# Patient Record
Sex: Male | Born: 1955 | Race: White | Hispanic: No | Marital: Married | Smoking: Never smoker
Health system: Southern US, Community
[De-identification: ages and names within clinical notes are randomized; demographics above are authoritative.]

## PROBLEM LIST (undated history)

## (undated) DIAGNOSIS — R040 Epistaxis: Secondary | ICD-10-CM

## (undated) DIAGNOSIS — Z8711 Personal history of peptic ulcer disease: Secondary | ICD-10-CM

## (undated) DIAGNOSIS — R51 Headache: Secondary | ICD-10-CM

## (undated) DIAGNOSIS — I951 Orthostatic hypotension: Secondary | ICD-10-CM

## (undated) DIAGNOSIS — G629 Polyneuropathy, unspecified: Secondary | ICD-10-CM

## (undated) DIAGNOSIS — G319 Degenerative disease of nervous system, unspecified: Secondary | ICD-10-CM

## (undated) DIAGNOSIS — C801 Malignant (primary) neoplasm, unspecified: Secondary | ICD-10-CM

## (undated) DIAGNOSIS — G238 Other specified degenerative diseases of basal ganglia: Secondary | ICD-10-CM

## (undated) DIAGNOSIS — I429 Cardiomyopathy, unspecified: Secondary | ICD-10-CM

## (undated) DIAGNOSIS — D899 Disorder involving the immune mechanism, unspecified: Secondary | ICD-10-CM

## (undated) DIAGNOSIS — G20 Parkinson's disease: Secondary | ICD-10-CM

## (undated) DIAGNOSIS — Z5189 Encounter for other specified aftercare: Secondary | ICD-10-CM

## (undated) DIAGNOSIS — Z8719 Personal history of other diseases of the digestive system: Secondary | ICD-10-CM

## (undated) DIAGNOSIS — G43909 Migraine, unspecified, not intractable, without status migrainosus: Secondary | ICD-10-CM

## (undated) DIAGNOSIS — J329 Chronic sinusitis, unspecified: Secondary | ICD-10-CM

## (undated) DIAGNOSIS — Z941 Heart transplant status: Secondary | ICD-10-CM

## (undated) DIAGNOSIS — K219 Gastro-esophageal reflux disease without esophagitis: Secondary | ICD-10-CM

## (undated) DIAGNOSIS — K635 Polyp of colon: Secondary | ICD-10-CM

## (undated) DIAGNOSIS — Z9109 Other allergy status, other than to drugs and biological substances: Secondary | ICD-10-CM

## (undated) DIAGNOSIS — N289 Disorder of kidney and ureter, unspecified: Secondary | ICD-10-CM

## (undated) DIAGNOSIS — I509 Heart failure, unspecified: Secondary | ICD-10-CM

## (undated) DIAGNOSIS — N179 Acute kidney failure, unspecified: Secondary | ICD-10-CM

## (undated) DIAGNOSIS — R251 Tremor, unspecified: Secondary | ICD-10-CM

## (undated) DIAGNOSIS — I10 Essential (primary) hypertension: Secondary | ICD-10-CM

## (undated) DIAGNOSIS — C4491 Basal cell carcinoma of skin, unspecified: Secondary | ICD-10-CM

## (undated) DIAGNOSIS — Q999 Chromosomal abnormality, unspecified: Secondary | ICD-10-CM

## (undated) DIAGNOSIS — G35 Multiple sclerosis: Secondary | ICD-10-CM

## (undated) DIAGNOSIS — R55 Syncope and collapse: Secondary | ICD-10-CM

## (undated) DIAGNOSIS — T7840XA Allergy, unspecified, initial encounter: Secondary | ICD-10-CM

## (undated) DIAGNOSIS — I519 Heart disease, unspecified: Secondary | ICD-10-CM

## (undated) DIAGNOSIS — N189 Chronic kidney disease, unspecified: Secondary | ICD-10-CM

## (undated) DIAGNOSIS — Z959 Presence of cardiac and vascular implant and graft, unspecified: Secondary | ICD-10-CM

## (undated) DIAGNOSIS — Z87442 Personal history of urinary calculi: Secondary | ICD-10-CM

## (undated) DIAGNOSIS — Z79899 Other long term (current) drug therapy: Secondary | ICD-10-CM

## (undated) DIAGNOSIS — B078 Other viral warts: Secondary | ICD-10-CM

## (undated) HISTORY — DX: Degenerative disease of nervous system, unspecified: G31.9

## (undated) HISTORY — DX: Chromosomal abnormality, unspecified: Q99.9

## (undated) HISTORY — DX: Heart transplant status: Z94.1

## (undated) HISTORY — DX: Other long term (current) drug therapy: Z79.899

## (undated) HISTORY — DX: Tremor, unspecified: R25.1

## (undated) HISTORY — DX: Migraine, unspecified, not intractable, without status migrainosus: G43.909

## (undated) HISTORY — PX: CARDIAC DEFIBRILLATOR PLACEMENT: SHX171

## (undated) HISTORY — DX: Polyp of colon: K63.5

## (undated) HISTORY — DX: Malignant (primary) neoplasm, unspecified: C80.1

## (undated) HISTORY — DX: Heart disease, unspecified: I51.9

## (undated) HISTORY — DX: Basal cell carcinoma of skin, unspecified: C44.91

## (undated) HISTORY — DX: Cardiomyopathy, unspecified: I42.9

## (undated) HISTORY — DX: Other viral warts: B07.8

## (undated) HISTORY — DX: Other allergy status, other than to drugs and biological substances: Z91.09

## (undated) HISTORY — DX: Acute kidney failure, unspecified: N17.9

## (undated) HISTORY — DX: Chronic kidney disease, unspecified: N18.9

## (undated) HISTORY — DX: Personal history of peptic ulcer disease: Z87.11

## (undated) HISTORY — DX: Encounter for other specified aftercare: Z51.89

## (undated) HISTORY — DX: Chronic sinusitis, unspecified: J32.9

## (undated) HISTORY — DX: Presence of cardiac and vascular implant and graft, unspecified: Z95.9

## (undated) HISTORY — DX: Epistaxis: R04.0

## (undated) HISTORY — DX: Polyneuropathy, unspecified: G62.9

## (undated) HISTORY — DX: Orthostatic hypotension: I95.1

## (undated) HISTORY — DX: Allergy, unspecified, initial encounter: T78.40XA

## (undated) HISTORY — DX: Gastro-esophageal reflux disease without esophagitis: K21.9

## (undated) HISTORY — PX: CATARACT EXTRACTION W/ INTRAOCULAR LENS  IMPLANT, BILATERAL: SHX1307

## (undated) HISTORY — PX: CARDIAC SURGERY: SHX584

## (undated) HISTORY — DX: Personal history of other diseases of the digestive system: Z87.19

## (undated) HISTORY — DX: Disorder involving the immune mechanism, unspecified: D89.9

## (undated) HISTORY — DX: Parkinson's disease: G20

---

## 1996-09-20 DIAGNOSIS — G43909 Migraine, unspecified, not intractable, without status migrainosus: Secondary | ICD-10-CM

## 1996-09-20 HISTORY — DX: Migraine, unspecified, not intractable, without status migrainosus: G43.909

## 1997-09-20 DIAGNOSIS — I429 Cardiomyopathy, unspecified: Secondary | ICD-10-CM

## 1997-09-20 HISTORY — DX: Cardiomyopathy, unspecified: I42.9

## 1998-09-04 DIAGNOSIS — I509 Heart failure, unspecified: Secondary | ICD-10-CM

## 1998-09-04 HISTORY — DX: Heart failure, unspecified: I50.9

## 1998-09-09 ENCOUNTER — Observation Stay (HOSPITAL_COMMUNITY): Admission: AD | Admit: 1998-09-09 | Discharge: 1998-09-10 | Payer: Self-pay | Admitting: Cardiology

## 2000-12-27 ENCOUNTER — Ambulatory Visit (HOSPITAL_COMMUNITY): Admission: RE | Admit: 2000-12-27 | Discharge: 2000-12-27 | Payer: Self-pay | Admitting: *Deleted

## 2001-10-28 ENCOUNTER — Inpatient Hospital Stay (HOSPITAL_COMMUNITY): Admission: EM | Admit: 2001-10-28 | Discharge: 2001-11-03 | Payer: Self-pay

## 2001-11-03 ENCOUNTER — Encounter: Payer: Self-pay | Admitting: Cardiology

## 2003-07-11 ENCOUNTER — Encounter: Payer: Self-pay | Admitting: Emergency Medicine

## 2003-07-11 ENCOUNTER — Emergency Department (HOSPITAL_COMMUNITY): Admission: EM | Admit: 2003-07-11 | Discharge: 2003-07-11 | Payer: Self-pay | Admitting: Emergency Medicine

## 2003-07-12 ENCOUNTER — Inpatient Hospital Stay (HOSPITAL_COMMUNITY): Admission: AD | Admit: 2003-07-12 | Discharge: 2003-07-27 | Payer: Self-pay | Admitting: Internal Medicine

## 2003-07-16 ENCOUNTER — Encounter: Payer: Self-pay | Admitting: Cardiology

## 2003-07-17 ENCOUNTER — Encounter: Payer: Self-pay | Admitting: Internal Medicine

## 2003-09-21 DIAGNOSIS — Z941 Heart transplant status: Secondary | ICD-10-CM

## 2003-09-21 HISTORY — DX: Heart transplant status: Z94.1

## 2003-09-21 HISTORY — PX: CARDIAC CATHETERIZATION: SHX172

## 2004-03-13 ENCOUNTER — Inpatient Hospital Stay (HOSPITAL_COMMUNITY): Admission: EM | Admit: 2004-03-13 | Discharge: 2004-03-25 | Payer: Self-pay | Admitting: Emergency Medicine

## 2004-03-13 ENCOUNTER — Encounter: Payer: Self-pay | Admitting: Cardiology

## 2004-04-08 ENCOUNTER — Emergency Department (HOSPITAL_COMMUNITY): Admission: EM | Admit: 2004-04-08 | Discharge: 2004-04-09 | Payer: Self-pay | Admitting: Emergency Medicine

## 2004-04-08 ENCOUNTER — Ambulatory Visit (HOSPITAL_COMMUNITY): Admission: RE | Admit: 2004-04-08 | Discharge: 2004-04-08 | Payer: Self-pay | Admitting: Cardiology

## 2004-04-13 ENCOUNTER — Inpatient Hospital Stay (HOSPITAL_COMMUNITY): Admission: EM | Admit: 2004-04-13 | Discharge: 2004-04-20 | Payer: Self-pay | Admitting: Internal Medicine

## 2004-04-20 HISTORY — PX: HEART TRANSPLANT: SHX268

## 2004-05-10 DIAGNOSIS — Z941 Heart transplant status: Secondary | ICD-10-CM

## 2004-05-10 HISTORY — DX: Heart transplant status: Z94.1

## 2004-05-13 DIAGNOSIS — R251 Tremor, unspecified: Secondary | ICD-10-CM

## 2004-05-13 HISTORY — DX: Tremor, unspecified: R25.1

## 2005-06-09 ENCOUNTER — Emergency Department (HOSPITAL_COMMUNITY): Admission: EM | Admit: 2005-06-09 | Discharge: 2005-06-09 | Payer: Self-pay | Admitting: Emergency Medicine

## 2005-09-20 DIAGNOSIS — G629 Polyneuropathy, unspecified: Secondary | ICD-10-CM

## 2005-09-20 HISTORY — DX: Polyneuropathy, unspecified: G62.9

## 2005-11-21 IMAGING — CR DG CHEST 1V PORT
1 series · 1 of 1 positions shown · non-contrast
Comparison: none

CLINICAL DATA: Fever.  CHF.
 PORTABLE CHEST
 Comparison to 03/14/04. 
 Enlargement of the cardiopericardial silhouette with bibasilar atelectasis, pulmonary vascular congestion noted.  Slight improved aeration of the lung bases noted.  Pacer/AICD again noted. 
 IMPRESSION
 Slight improved aeration lung bases.  Otherwise unchanged chest.

[view not recorded]
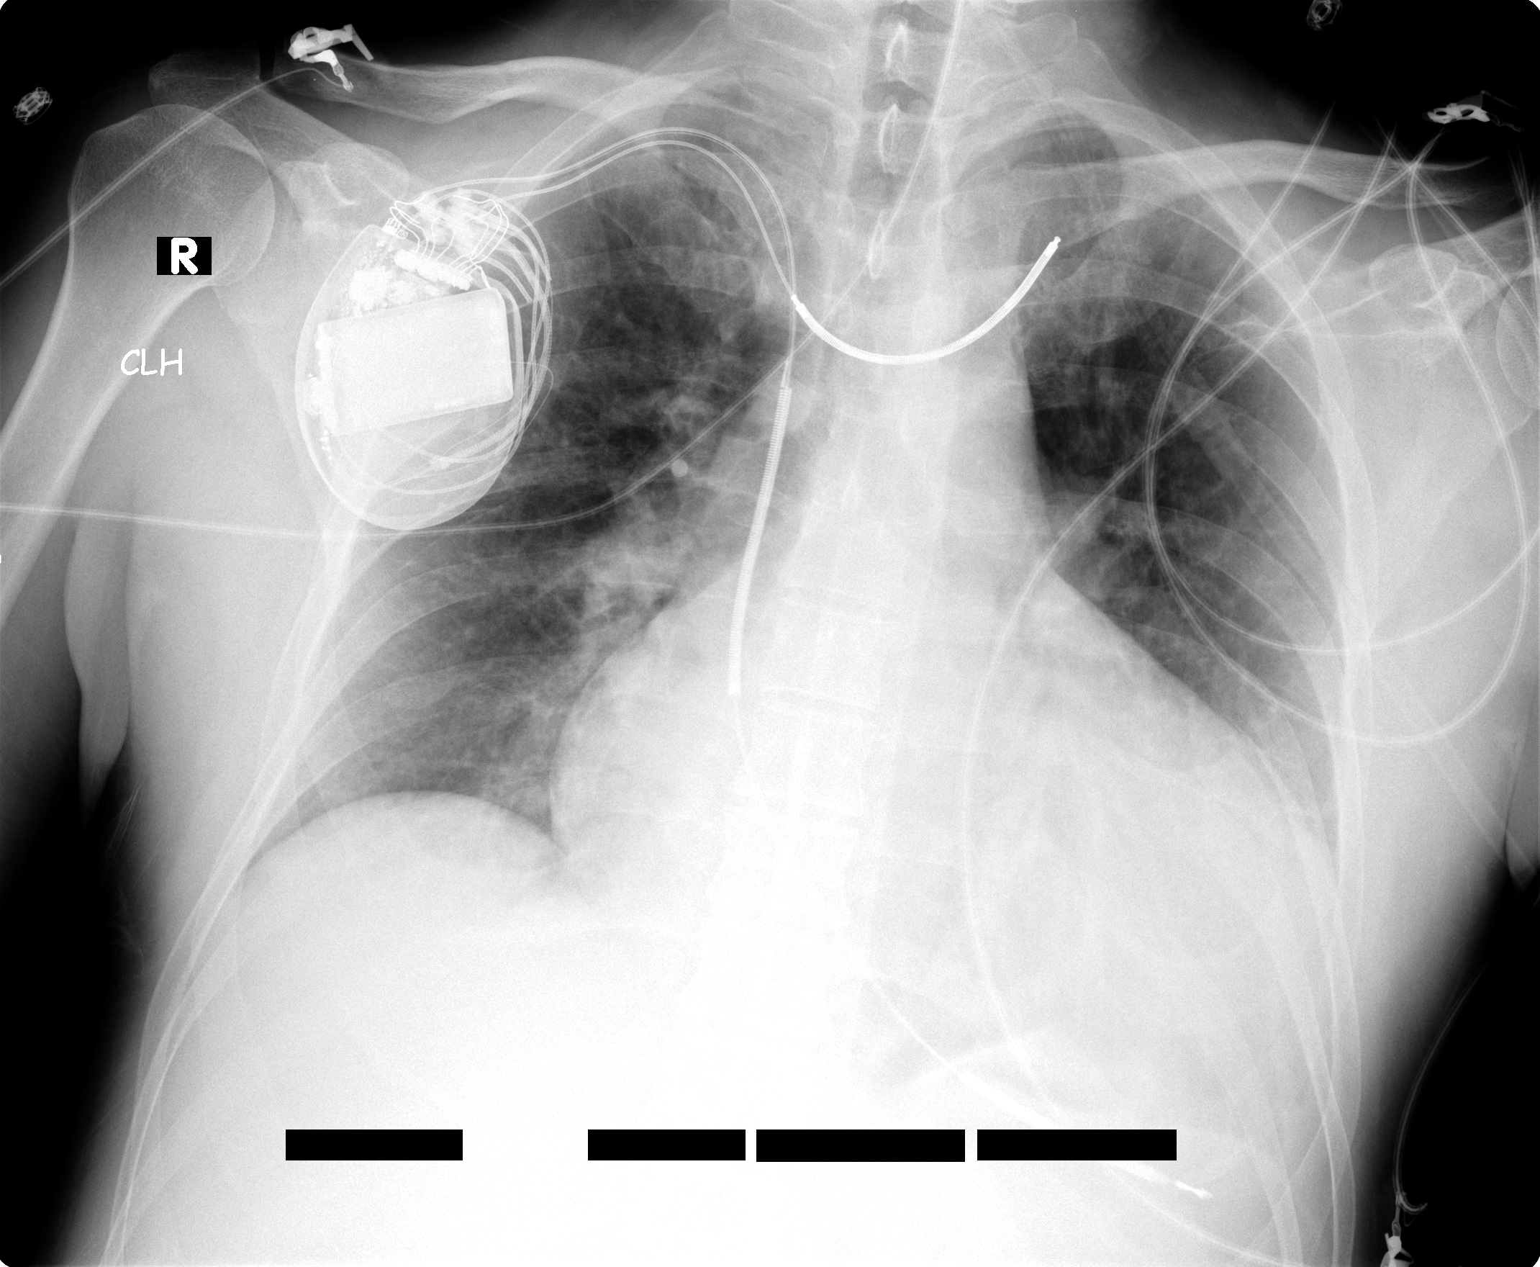

[1 of 1 positions shown; findings below may reference images not displayed]

## 2005-11-23 IMAGING — CR DG CHEST 1V PORT
1 series · 1 of 1 positions shown · non-contrast
Comparison: 03/15/04.

CLINICAL DATA: CHF and fever.  
 PORTABLE SINGLE VIEW CHEST - 03/17/04 - [DATE] HOURS

[view not recorded]
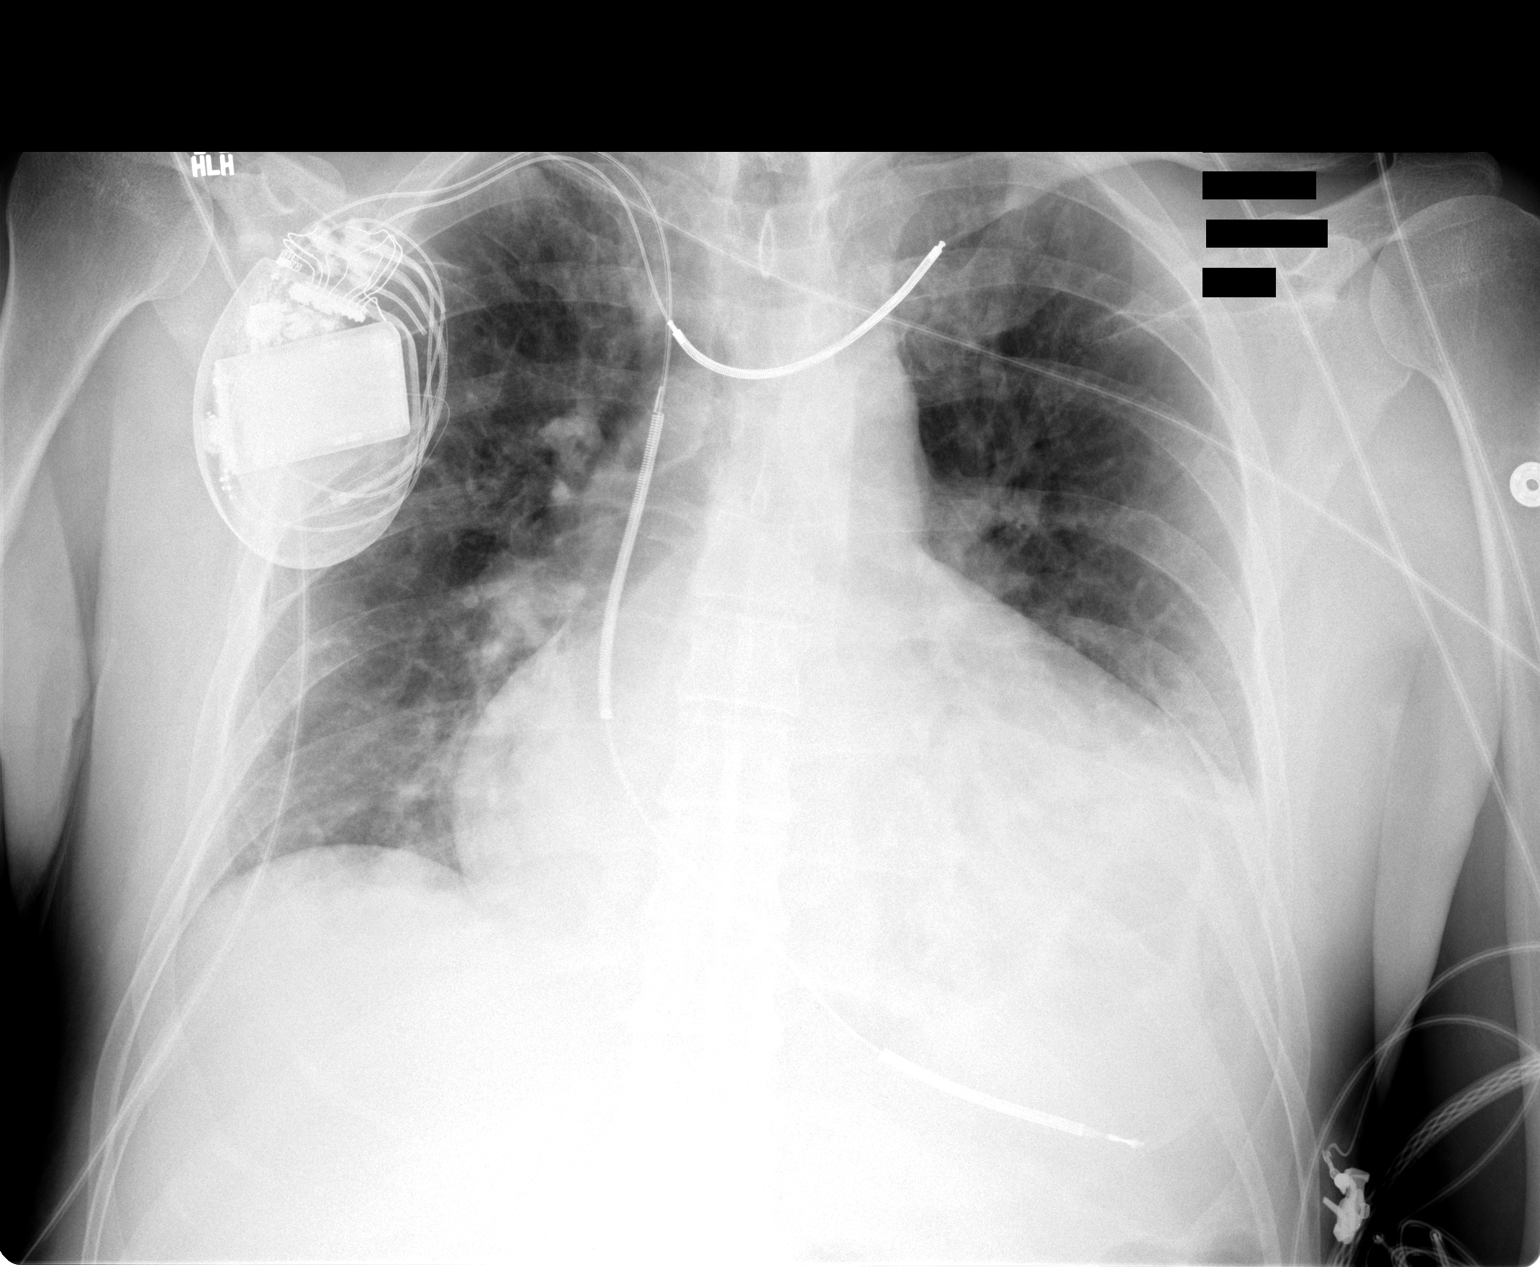

[1 of 1 positions shown; findings below may reference images not displayed]

Stable pulmonary venous hypertension and cardiomegaly.  
 IMPRESSION
 Stable appearance of chest.

## 2005-11-25 IMAGING — CR DG CHEST 2V
2 series · 2 of 2 positions shown · non-contrast
Comparison: 03/17/04.

CLINICAL DATA: Status-post removal of cardiac defibrillator. 
 TWO VIEW CHEST

[view not recorded (1 of 2)]
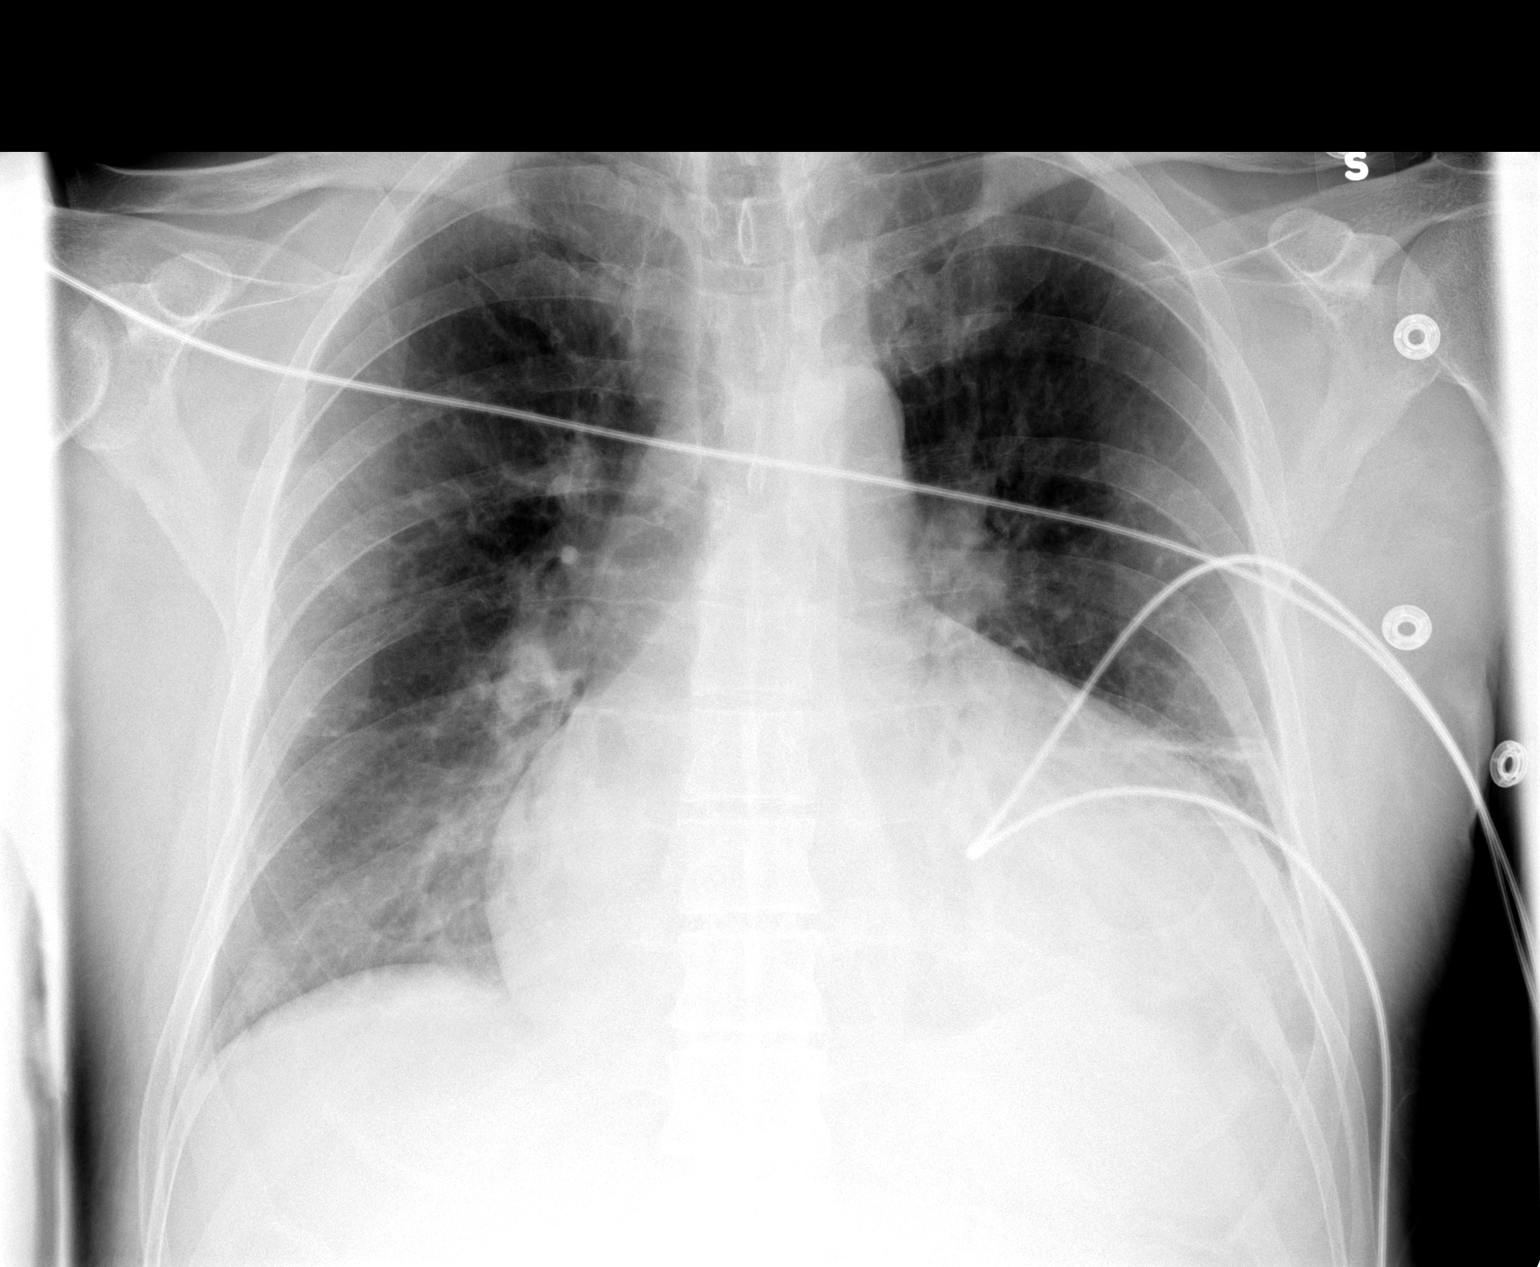

[view not recorded (2 of 2)]
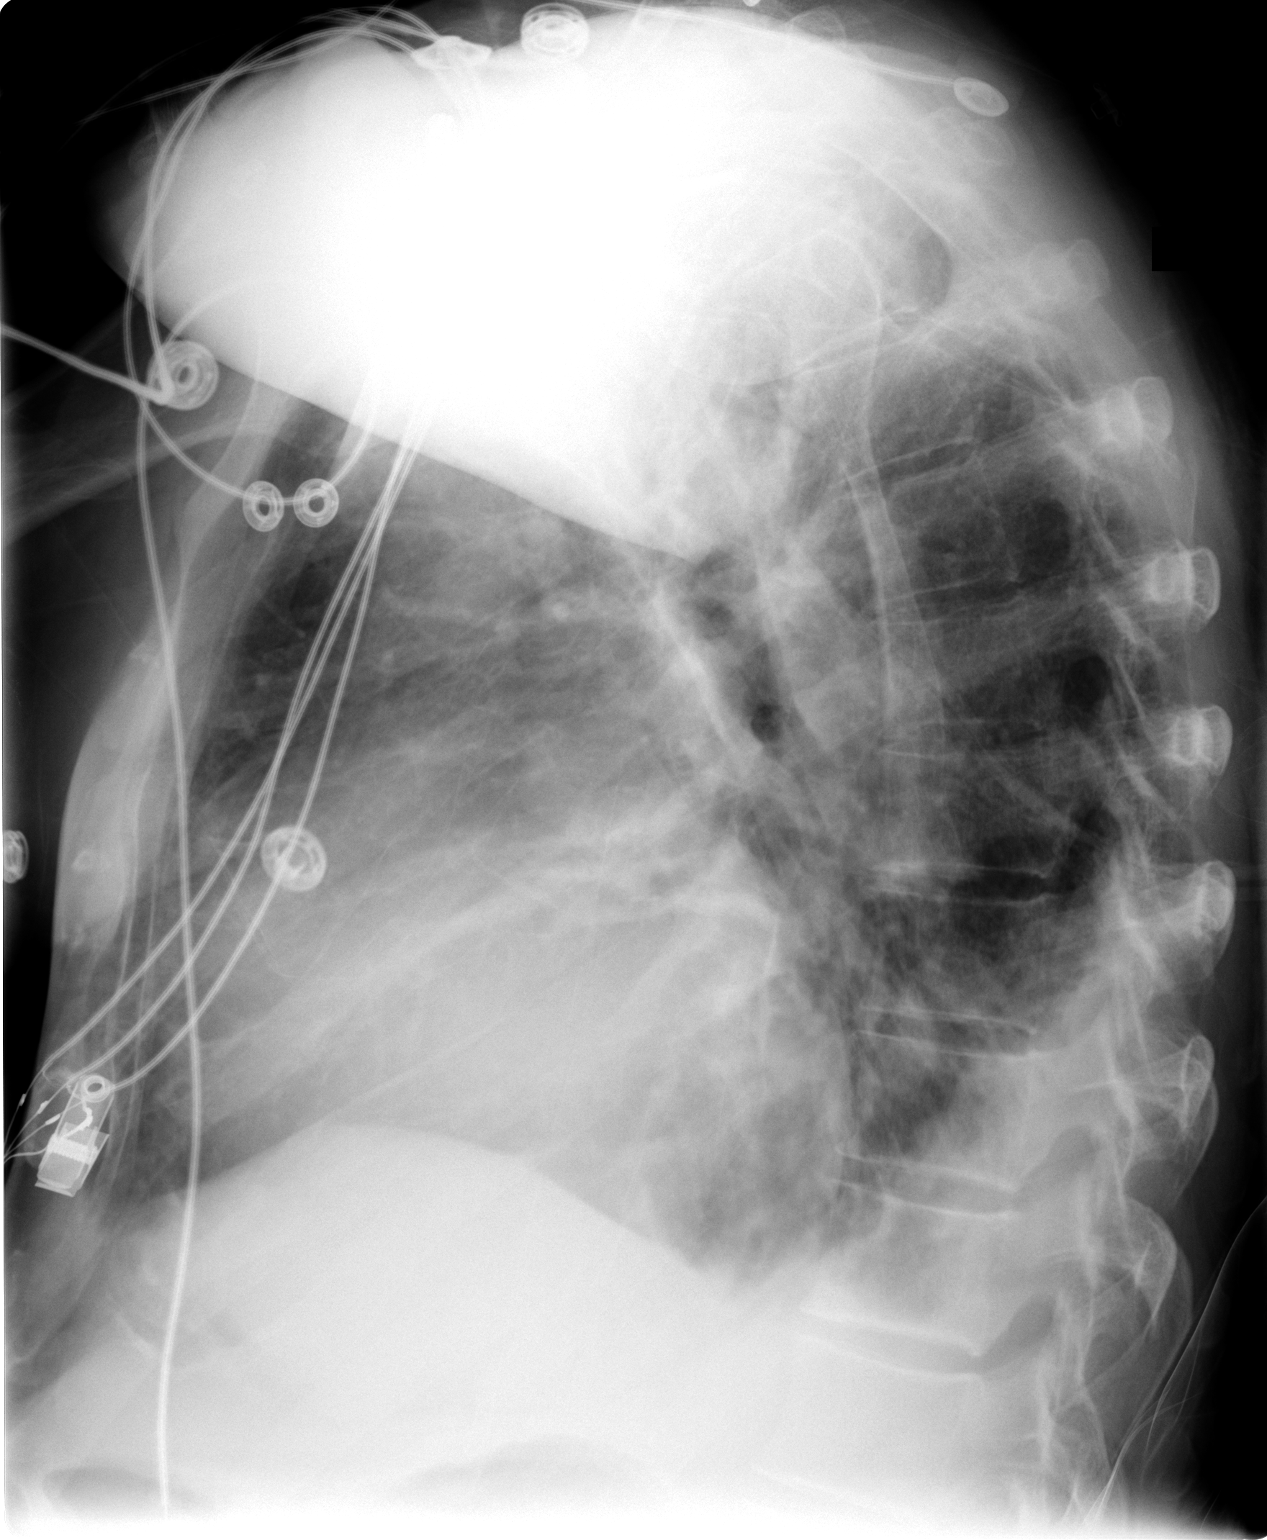

[2 of 2 positions shown; findings below may reference images not displayed]

Right-sided defibrillator and wires have been removed.  No pneumothorax. Stable cardiomegaly.  Stable venous hypertensive changes without overt edema.  Lateral view shows small bilateral pleural effusions.
 IMPRESSION
 No pneumothorax after defibrillator removal.  Stable cardiomegaly.  Small bilateral pleural effusions.

## 2005-12-01 IMAGING — CR DG CHEST 1V PORT
1 series · 1 of 1 positions shown · non-contrast
Comparison: none

CLINICAL DATA: Fever of unknown origin.  CHF.  PICC line placement.
 PORTABLE CHEST, ONE VIEW ? 03/25/2004
 A single portable view of the chest compared with previous study from 03/19/2004.
 The heart is enlarged but stable.  There is a left-sided PICC line with its tip in good position in the distal SVC.  There are persistent small bilateral pleural effusions and bibasilar atelectasis.  No pulmonary edema.
 IMPRESSION
 1. Left-sided PICC line in good position with its tip in the distal SVC.
 2. Stable cardiac enlargement with bilateral effusions and bibasilar atelectasis.

[view not recorded]
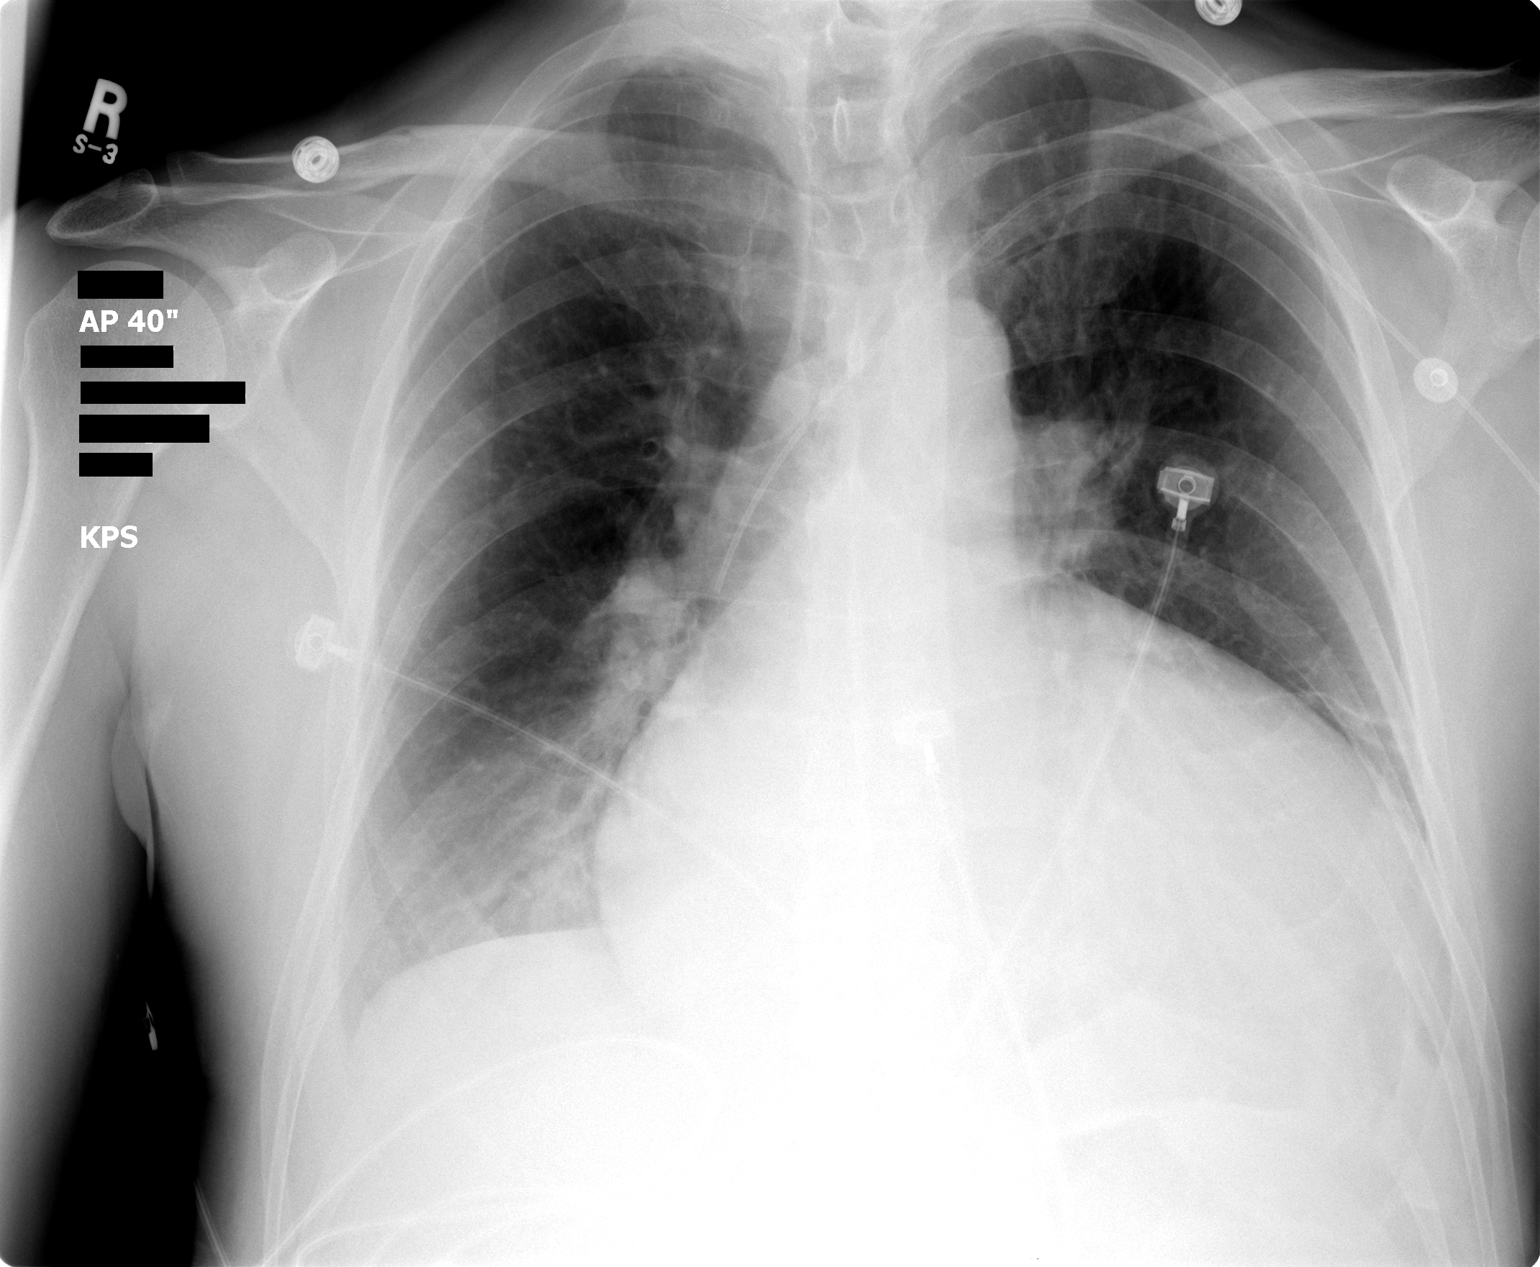

[1 of 1 positions shown; findings below may reference images not displayed]

## 2007-03-05 ENCOUNTER — Emergency Department (HOSPITAL_COMMUNITY): Admission: EM | Admit: 2007-03-05 | Discharge: 2007-03-05 | Payer: Self-pay | Admitting: *Deleted

## 2007-08-12 ENCOUNTER — Emergency Department (HOSPITAL_COMMUNITY): Admission: EM | Admit: 2007-08-12 | Discharge: 2007-08-12 | Payer: Self-pay | Admitting: Emergency Medicine

## 2007-08-14 ENCOUNTER — Emergency Department (HOSPITAL_COMMUNITY): Admission: EM | Admit: 2007-08-14 | Discharge: 2007-08-14 | Payer: Self-pay | Admitting: Family Medicine

## 2008-09-27 ENCOUNTER — Encounter: Admission: RE | Admit: 2008-09-27 | Discharge: 2008-09-27 | Payer: Self-pay | Admitting: Internal Medicine

## 2010-08-30 ENCOUNTER — Observation Stay (HOSPITAL_COMMUNITY)
Admission: EM | Admit: 2010-08-30 | Discharge: 2010-08-31 | Payer: Self-pay | Source: Home / Self Care | Attending: Internal Medicine | Admitting: Internal Medicine

## 2010-11-30 LAB — DIFFERENTIAL
Lymphocytes Relative: 13 % (ref 12–46)
Lymphs Abs: 0.7 10*3/uL (ref 0.7–4.0)
Neutrophils Relative %: 70 % (ref 43–77)

## 2010-11-30 LAB — POCT I-STAT, CHEM 8
Creatinine, Ser: 1.9 mg/dL — ABNORMAL HIGH (ref 0.4–1.5)
Hemoglobin: 14.3 g/dL (ref 13.0–17.0)
Sodium: 140 mEq/L (ref 135–145)
TCO2: 29 mmol/L (ref 0–100)

## 2010-11-30 LAB — URINALYSIS, ROUTINE W REFLEX MICROSCOPIC
Protein, ur: NEGATIVE mg/dL
Urobilinogen, UA: 0.2 mg/dL (ref 0.0–1.0)

## 2010-11-30 LAB — MAGNESIUM: Magnesium: 1.6 mg/dL (ref 1.5–2.5)

## 2010-11-30 LAB — COMPREHENSIVE METABOLIC PANEL
ALT: 17 U/L (ref 0–53)
Albumin: 4 g/dL (ref 3.5–5.2)
Alkaline Phosphatase: 37 U/L — ABNORMAL LOW (ref 39–117)
Glucose, Bld: 93 mg/dL (ref 70–99)
Potassium: 4 mEq/L (ref 3.5–5.1)
Sodium: 141 mEq/L (ref 135–145)
Total Protein: 6.9 g/dL (ref 6.0–8.3)

## 2010-11-30 LAB — BASIC METABOLIC PANEL
BUN: 26 mg/dL — ABNORMAL HIGH (ref 6–23)
BUN: 30 mg/dL — ABNORMAL HIGH (ref 6–23)
CO2: 29 mEq/L (ref 19–32)
Calcium: 9.2 mg/dL (ref 8.4–10.5)
Creatinine, Ser: 1.71 mg/dL — ABNORMAL HIGH (ref 0.4–1.5)
GFR calc Af Amer: 50 mL/min — ABNORMAL LOW (ref >60)
GFR calc non Af Amer: 42 mL/min — ABNORMAL LOW (ref >60)
GFR calc non Af Amer: 45 mL/min — ABNORMAL LOW (ref >60)
Glucose, Bld: 102 mg/dL — ABNORMAL HIGH (ref 70–99)
Potassium: 3.9 mEq/L (ref 3.5–5.1)
Potassium: 4 mEq/L (ref 3.5–5.1)

## 2010-11-30 LAB — POCT CARDIAC MARKERS
Myoglobin, poc: 212 ng/mL (ref 12–200)
Troponin i, poc: <0.05 ng/mL (ref 0.00–0.09)

## 2010-11-30 LAB — GLUCOSE, CAPILLARY: Glucose-Capillary: 109 mg/dL — ABNORMAL HIGH (ref 70–99)

## 2010-11-30 LAB — CBC
HCT: 38.8 % — ABNORMAL LOW (ref 39.0–52.0)
Hemoglobin: 12.7 g/dL — ABNORMAL LOW (ref 13.0–17.0)
MCHC: 32.7 g/dL (ref 30.0–36.0)
MCHC: 34.1 g/dL (ref 30.0–36.0)
Platelets: 142 10*3/uL — ABNORMAL LOW (ref 150–400)
Platelets: 148 10*3/uL — ABNORMAL LOW (ref 150–400)
RBC: 4.19 MIL/uL — ABNORMAL LOW (ref 4.22–5.81)
RBC: 4.35 MIL/uL (ref 4.22–5.81)
RDW: 12.6 % (ref 11.5–15.5)
WBC: 5.6 10*3/uL (ref 4.0–10.5)
WBC: 6.6 10*3/uL (ref 4.0–10.5)

## 2010-11-30 LAB — PROTIME-INR
INR: 0.98 (ref 0.00–1.49)
Prothrombin Time: 13.2 seconds (ref 11.6–15.2)

## 2010-11-30 LAB — CK TOTAL AND CKMB (NOT AT ARMC): Total CK: 88 U/L (ref 7–232)

## 2010-11-30 LAB — CARDIAC PANEL(CRET KIN+CKTOT+MB+TROPI)
CK, MB: 1 ng/mL (ref 0.3–4.0)
CK, MB: 1.5 ng/mL (ref 0.3–4.0)
Relative Index: INVALID (ref 0.0–2.5)
Relative Index: INVALID (ref 0.0–2.5)
Total CK: 80 U/L (ref 7–232)
Total CK: 92 U/L (ref 7–232)
Troponin I: 0.01 ng/mL (ref 0.00–0.06)

## 2010-11-30 LAB — LIPID PANEL
HDL: 49 mg/dL (ref >39)
Total CHOL/HDL Ratio: 2.6 RATIO
Triglycerides: 58 mg/dL (ref <150)
VLDL: 12 mg/dL (ref 0–40)

## 2010-11-30 LAB — HEMOGLOBIN A1C: Hgb A1c MFr Bld: 5.7 % — ABNORMAL HIGH (ref <5.7)

## 2010-11-30 LAB — D-DIMER, QUANTITATIVE: D-Dimer, Quant: <0.22 ug/mL-FEU (ref 0.00–0.48)

## 2011-02-05 NOTE — Consult Note (Signed)
NAME:  Darin Vega, Darin Vega                          ACCOUNT NO.:  [id number]   MEDICAL RECORD NO.:  [id number]                   PATIENT TYPE:  INP   LOCATION:  0450                                 FACILITY:  [facility]   PHYSICIAN:  Melvyn Novas, M.D.               DATE OF BIRTH:  [birth year]   DATE OF CONSULTATION:  07/12/2003  DATE OF DISCHARGE:                                   CONSULTATION   HISTORY OF PRESENT ILLNESS:  This gentleman is a right-handed Caucasian  gentleman seen today on 07/12/03 in a neurologic consultation.  The patient  was admitted last night through the emergency room to the hospital with  bifrontal headaches, fever, and malaise.  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  He is anticoagulated by Coumadin, and  currently has an INR of 1.7.  Blood cultures were obtained, and returned  positive for Staphylococcus aureus.  Bacteremia is suspected, perhaps  sepsis.  The patient has some indwelling hardware, and implanted  defibrillator device, but this is unlikely the source of infection; however,  it might maintain an infection if becoming infected itself.  The patient is  not meningitic, and shows no sign of encephalopathy.  He is pleasant, alert,  and helps me to obtain his past medical history, medications, etc.  He  states the headaches are non-migrainous in nature.  He has no photophobia,  and no nausea with it.  He has a decreased appetite.  He is not increasingly  sleepy.  He shows full neck range of motion.  No rigor.  He has never had a  seizure.   PAST MEDICAL HISTORY:  1. Dilated cardiomyopathy.  2. Migrainous headaches.  3. Colonic polyposis.  4. History of left bundle branch block.  5. AICD.  6. GERD.  7. Nephrolithiasis.  8. Hyperparathyroidism.  9. I also read the abbreviation history of SAB, and suspect this stands for     subarachnoid bleed, but the patient is not aware of having ever had any     intracranial  abnormalities.  He does remember that it was commented upon,     and ER obtained CT scan, that he has a lot of calcifications in his     meninges.   MEDICATIONS:  I quote this from the list obtained by the primary care.  1. Lasix 40 mg in the a.m.  2. Coumadin three days a week, 5 mg, alternating with 2.5 mg four days a     week.  3. Coreg 25 mg b.i.d.  4. Diovan 80 mg in a.m., 40 mg in p.m.  5. Aldactone 25 mg.  6. Prilosec 20 mg b.i.d.  7. Allegra p.r.n.   FAMILY HISTORY:  No cardiomyopathy trait in other family members.  No other  cases known.   SOCIAL HISTORY:  The patient is married.  No children.  Denies alcohol or  smoking.  Works as a Midwife.  No illicit drug use.  Unaware of any  travel to exotic countries or unusual infections in his past.   REVIEW OF SYSTEMS:  No shortness of breath, no peripheral edema.  Except for  the headaches, no mental status changes, neck discomfort, rigidity, and no  history of seizures.   PHYSICAL EXAMINATION:  VITAL SIGNS:  Temperature now T max over 100 degrees  Fahrenheit, reached today 101 degrees Fahrenheit.  Low blood pressures of  90/60, but for the patient this is a normal range, given his cardiomyopathy  history.  Heart rate is 68 and regular.  Pulses are palpable throughout.  NECK:  He has no carotid bruits.  SKIN:  Normal color.  No cyanosis, and plump mucous membranes, without signs  of infection or inflammation.  LUNGS:  Bibasilar mild rales, but good expansion and effort.  ABDOMEN:  Soft.  There is no peripheral edema.  NEUROLOGIC:  The patient has intact mental status, alert, and oriented x3.  Full memory and attention span.  No apraxia, aphasia, dysarthria.  Cranial  nerves - pupils are equal to light and accommodation.  Full visual field  bilateral, sensory and motor stimulation.  No papilledema.  No tongue or  uvula deviation.  Facial symmetry is intact.  Facial sensory is intact.  Neck shows full range of motion.   Shoulder shrug is bilaterally possible and  equal.  Motor exam shows 5/5 equal strength, tone, and mass bilaterally.  No  cogwheeling.  No rigor.  No Brudzinski or Kernig sign.  Deep tendon reflexes  1+.  Downgoing toes to plantar stimulation.  No myoclonus.  No clonus.  Intact to all modalities and also the sensories.  He has normal finger-to-  nose without ataxia, dysmetria, or tremor by coordination test.  No Romberg  sign.   ASSESSMENT:  Symptomatic headache secondary to an infectious process that  has not affected primarily brain or meninges.  Again, no meningitic or  encephalitis symptoms are seen.  The patient should be able to receive  relief from regular pain medication.  The headaches have no migrainous  character.   PLAN:  Continue with vancomycin, and especially with Rocephin, as Rocephin  will penetrate the blood-brain barrier, and prevent, hopefully, and CNS  involvement.  The patient can continue the Coumadin.  A CSF tap at this  point is not needed due to his clinical presentation lacking seizures,  mental status changes, or rigor.  I would, however, generously cover with  blood-brain barrier penetrating antibiotics, and would kindly ask to involve  infectious disease consultants.  I would also repeat the blood cultures.   I thank Dr. Eloise Harman very much for allowing me to participate in this  pleasant gentleman's care.                                               Melvyn Novas, M.D.    CD/MEDQ  D:  07/12/2003  T:  07/12/2003  Job:  [id number]

## 2011-02-05 NOTE — H&P (Signed)
NAME:  Darin Vega, BUSS NO.:  [id number]   MEDICAL RECORD NO.:  [id number]                   PATIENT TYPE:  EMS   LOCATION:  MAJO                                 FACILITY:  [facility]   PHYSICIAN:  Gaspar Garbe, M.D.            DATE OF BIRTH:  [birth year]   DATE OF ADMISSION:  03/12/2004  DATE OF DISCHARGE:                                HISTORY & PHYSICAL   PRIMARY PHYSICIAN:  Barry Dienes. Eloise Harman, M.D.   CHIEF COMPLAINT:  Shortness of breath, fevers.   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  He received subsequently six  months' worth of Rocephin and has been off this since the beginning of May.  His wife has noticed that he has had some increased fatigability.  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  He has been getting worse over the past couple of weeks, and they  have seen Dr. Swaziland for the past two Tuesdays.  Dr. [place]iland did a chest x-  ray and some labs several weeks ago and had an increase in his Lasix and was  seen last Tuesday with a further increase.  The patient has not noticed an  increase in his ability to be able to do things, however, and he has been  having fevers for approximately the past day.  Although, it is very  difficult to tell as the patient does not want to give much of a history,  and the wife is telling everything for him.  She indicates that he has had a  dry hacking cough as well.  This evening, when he went out to take the dogs  to potty, the patient came inside and felt to be somewhat cold, short of  breath, and seemed to be clutching his left side.  His wife asked him to go  to the emergency room, and he refused.  He says he did not want to be seen  this evening.  She talked to a nurse who lives near them, and  they  recommended that they go by ambulance which was still something that the  patient did not want to do, but he finally gave into his wife and came into  the emergency room.  The patient was cold, clammy, diaphoretic when he first  arrived here.  He was given fluids and put on dobutamine, per the ER  physician.  I am asked to admit the patient and manage him as well.   ALLERGIES:  1. PENICILLIN.  He has tolerated cephalosporins fine in the past, completing     a six month course of Rocephin.  2. PEANUT OIL.  3. ACE INHIBITORS cause a cough.   MEDICATIONS:  1.  Diovan 80 mg b.i.d.  2. Coumadin 5 mg Wednesdays and Saturdays, 2.5 mg all other days.  3. Sotalol 80 mg b.i.d.  4. Aldactone 25 mg every day.  5. Lasix 40 mg b.i.d. recently increased.  6. Aciphex 20 mg every day.  7. Clarinex 5 mg every day.  8. Nortriptyline 50 mg every day.  9. Coreg 25 mg b.i.d.   PAST MEDICAL HISTORY:  1. Idiopathic cardiomyopathy, dilated type.  Ejection fraction 15%  with an     AICD placed on February 2003, as well as a replacement, on November 2004,     secondary to bacterial endocarditis as above.  2. Methicillin-sensitive Staphylococcus aureus, as above.  3. History of migraines.  4. History of colon polyps.  5. Seasonal allergies.  6. Gastroesophageal reflux disease.   PAST SURGICAL HISTORY:  AICD placement as above.  It should be noted that  the patient has a Guidant AICD, H179.   SOCIAL HISTORY:  The patient lives in [place] with his wife and teenage  daughter.  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  He is a  nonsmoker, nondrinker.   FAMILY HISTORY:  Mother is alive in her 22s.  She has a history of coronary  artery disease and depression.  Father died two years ago of blood cancer.  He has a brother who has had kidney stones but no other heart history.   REVIEW OF SYSTEMS:  The patient today is having sweats and has had some  decreased hearing.  Denies any  headaches, sore throat, or nasal discharge at  this point.  Denies any chest pain at present but has had some dyspnea on  exertion and a dry hacking cough.  Denies any palpitations or increased  edema in his legs.  Denies any skin rashes.  Denies any urinary symptoms and  indicates that he has felt somewhat nauseated but not having any problem  with vomiting, diarrhea, and has had no myalgias.  All other systems are  negative.   ADVANCED DIRECTIVE:  The patient is a full code.   PHYSICAL EXAMINATION:  VITAL SIGNS:  Temperature 99.4, pulse 102,  respiratory rate 18, blood pressure 91/51 on 0.5 mcg of dopamine.  Oxygen  sat is 97% on 2 liters O2.  GENERAL:  The patient is somewhat sweaty, appears to be fairly comfortable.  HEENT:  Normocephalic, atraumatic.  PERRLA.  EOMI.  ENT is within normal  limits.  NECK:  Supple.  No lymphadenopathy noted.  The patient has 8- to -10-cm JVD.  Lymphadenopathy none.  HEART:  The patient has distant heart sounds which are tachycardic but no  murmur is appreciated.  LUNGS:  Clear to auscultation bilaterally.  CHEST:  The patient has a right sided pacemaker with a scar on his left side  from removal of his previous.  SKIN:  No rashes or lesions.  ABDOMEN:  Soft, nontender.  Normoactive bowel sounds.  No  hepatosplenomegaly.  EXTREMITIES:  No clubbing, cyanosis, or edema.  MUSCULOSKELETAL:  No joint deformities, effusions, or tenderness are noted.  No spine or CVA tenderness.  NEURO:  Oriented x 3.  Cranial nerves II-XII are intact.  Strength 5/5 with  1+ deep tendon reflexes.   Chest x-ray does not show any changes from prior hospitalization and the  pacer is on the right.  He does not have any evidence of pneumonia but does  have a globus heart consistent with his cardiomyopathy.   EKG shows a rate  of 107 with a sinus tach and a left bundle branch block  which is chronic for him.  There is no acuity.  LABS:  White count 14 with an absolute  neutrophil count of 13.  Hemoglobin  14.5, hematocrit 42.4, platelets 193, BUN and creatinine are 25 and 1.6  respectively.  Glucose slightly elevated at 110.  Potassium 3.6, sodium 135.  LFTs are within normal limits except for a slightly elevated bilirubin of  1.6.  Digoxin less than 0.2; however, the patient is not on digoxin.  This  was ordered by ER.  Prothrombin time 25.0, INR is 3.3, PTT 36.  ABG shows  7.43, 35, 98, 23, 97% on room air.  Urinalysis currently pending.   ASSESSMENT/PLAN:  Hypotension.  The patient is currently on dobutamine 0.5  mcg/kg/min.  He has been given fluids.  I have asked Dr. [name] with  cardiology to come see him for fluid and heart failure management.  I wonder  if dopamine might be a better choice for him as it is not a peripheral  vasodilator.  We will hold his blood pressure medicines until his systolics  get into the 100 range as they are per his usual.  We will likely need a  transesophageal echocardiogram to evaluate his valves and the pacer leads as  endocarditis was the cause of his hypotension and infection the last time he  was hospitalized.  Empiric vancomycin and cefepime are being given with  blood cultures x 2 and urinalysis and culture pending.  His chest x-ray was  not acute.  We will hold antihypertensive as listed above.  We will placed  him in the cardiac intensive care unit as he is on pressors and continue to  follow him throughout the evening.  We will rule him out for an myocardial  infarction as well.                                                Gaspar Garbe, M.D.    RWT/MEDQ  D:  03/13/2004  T:  03/14/2004  Job:  [id number]   cc:   Barry Dienes. Eloise Harman, M.D.  [address]  [place]  [place] [address]  Fax: [phone]   Peter M. Swaziland, M.D.  [address]  [place], [place] [address]  Fax: [phone]

## 2011-02-05 NOTE — H&P (Signed)
. [facility]  Patient:    BOLUWATIFE, FLIGHT Visit Number: [id number] MRN: [id number]          Service Type: MED Location: [phone] Attending Physician:  [place], Peter Manning Dictated by:   Meade Maw, M.D. Admit Date:  10/28/2001   CC:         Peter M. [place], M.D.   History and Physical  REASON FOR ADMISSION:  Syncope.  HISTORY OF PRESENT ILLNESS:  Draper Gallon is a 55 year old male with a past medical history of non-ischemic cardiomyopathy.  His ejection fraction is 99. There is no old chart available for my review.  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  He had no warning.  He denies tachyarrhythmia or palpitations.  He awoke feeling hot, but was otherwise without symptoms.  EMS witnessed the event.  They state that there was loss of consciousness, but he continued to have a pulse and respirations.  There was no bowel or bladder incontinence and no jerking movement.  He has had no complaints of presyncope, tachycardia, orthopnea, PND, or dyspnea.  He was evaluated by Peter M. Swaziland, M.D., on August 21, 2001, and had no change in his medication.  Yoltzin continues to work out at [employer] three days per week.  He rides a stationary bike for 30 minutes.  He has had no change in his activity tolerance.  PAST MEDICAL HISTORY:  Significant: 1. Non-ischemic cardiomyopathy. 2. Chronic intact coagulation. 3. Esophageal reflux. 4. Allergic rhinitis.  MEDICATIONS: 1. Coumadin 5 mg on Tuesday, Wednesday, and Saturday and 2.5 mg on Sunday,    Monday, and Thursday. 2. Spironolactone 25 mg q.d. 3. Diovan 80 mg p.o. q.d. 4. Coreg 25 mg p.o. b.i.d. 5. Allegra 60 mg p.o. b.i.d. 6. Prilosec 20 mg p.o. q.h.s. 7. Nortriptyline 100 mg p.o. q.h.s.  ALLERGIES:  He has no known drug allergies, but he does have a strong allergy to PEANUTS.  PAST SURGICAL HISTORY:  Left heart  catheterization performed in 1999.  SOCIAL HISTORY:  He is married.  He does a sedentary job with the [employer].  No history of alcohol, tobacco, or drug abuse.  REVIEW OF SYSTEMS:  As noted above.  He has had no change in bowel or bladder habits.  He has noted no blood in the stool.  He is unable to sleep flat at night primarily due to his esophageal reflux.  PHYSICAL EXAMINATION:  A pleasant, middle-aged male in no acute distress. Orlene Erm is appropriate.  He is alert.  VITAL SIGNS:  The blood pressure is 124/80, the heart rate is 92, the respiratory rate is 18, and he is afebrile.  HEENT:  Unremarkable.  NECK:  No evidence of neck vein distention.  No carotid bruits.  The thyroid is not palpable.  PULMONARY:  Breath sounds which are equal and clear to auscultation.  No use of accessory muscles.  CARDIOVASCULAR:  Regular rate and rhythm.  There is a wide split S1.  His PMI is slightly displaced laterally.  There are no audible murmurs.  ABDOMEN:  Soft, benign, and nontender.  No obvious hepatosplenomegaly is noted.  No unusual bruits or pulsations.  EXTREMITIES:  No peripheral edema.  His distal pulses are equal and palpable.  NEUROLOGIC:  Nonfocal.  Motor is 5/5 throughout.  LABORATORY DATA:  The ECG reveals a normal sinus rhythm with a left bundle branch block.  Telemetry reveals a sinus  rhythm with frequent PVCs.  The chest x-ray reveals cardiomegaly with no evidence of failure.  The white count is 5.9, hematocrit 39, and platelet count 176.  His electrolytes are within normal limits.  His initial CK is 67 with an MB of 1.4.  His INR is 2.6.  Normal liver enzymes.  IMPRESSION: 1. A 55 year old male with known cardiomyopathy.  He is admitted for a    syncopal episode and further monitoring of his rhythm.  He is noted to have    a bigeminal rhythm.  Will defer further cardiac work-up, including    echocardiogram and carotid Dopplers to Dr. Demetria Pore. Jordans  evaluation. 2. Non-ischemic cardiomyopathy.  He currently is clinically compensated.  We    will continue with is Diovan, his spironolactone, and Coreg. 3. Esophageal reflux.  Will continue with Prilosec 20 mg p.o. q.h.s. 4. Anticoagulation.  His INR is therapeutic.  We will continue him on his home    regimen. Dictated by:   Meade Maw, M.D. Attending Physician:  [place], Peter Manning DD:  10/28/01 TD:  10/28/01 Job: [id number]

## 2011-02-05 NOTE — Op Note (Signed)
NAME:  Darin Vega, Darin Vega                          ACCOUNT NO.:  [id number]   MEDICAL RECORD NO.:  [id number]                   PATIENT TYPE:  INP   LOCATION:  2302                                 FACILITY:  [facility]   PHYSICIAN:  Doylene Canning. Ladona Ridgel, M.D.               DATE OF BIRTH:  11-09-1955   DATE OF PROCEDURE:  07/17/2003  DATE OF DISCHARGE:                                 OPERATIVE REPORT   PROCEDURE:  Explantation of an implantable cardioverter defibrillator,  generator and leads and explantation of the subcutaneous array.   INDICATIONS FOR PROCEDURE:  ICD lead infection.   HISTORY OF PRESENT ILLNESS:  The patient is a very pleasant 55 year old man  with a dilated cardiomyopathy, left bundle branch block, and syncope. He  underwent ICD insertion approximately 20 months ago. He was in his usual  state of health until approximately three days ago when he developed fevers  and chills and headache. Blood cultures were drawn and 4/4 sets grew out  staph aureus. The patient was treated with IV antibiotics and his fever  defervesced. A transesophageal echo demonstrated small vegetation on the  defibrillation lead. He is now referred for ICD generator and lead removal  along with removal of his subcutaneous array.   DESCRIPTION OF PROCEDURE:  After informed consent was obtained, the patient  was taken to the operative suite in the fasted state. After the usual  preparation and draping, the anesthesia service was utilized to provide MAC  sedation. 30 mL of lidocaine was infiltrated into the left infraclavicular  region over the previous ICD scar. An 11 cm incision was carried out over  this region and electrocautery utilized to dissect down to the old ICD.  Inspection of the pocket demonstrated multiple dense fibrous adhesions on  the defibrillator, generator and lead body. There was no obvious active  infection. Cultures, however, were obtained. Extensive electrocautery  dissection was  then carried out in the subcutaneous pocket allowing the  defibrillator to be disconnected and freed from the previous ICD lead and  subcutaneous array.  At this point, electrocautery was utilized to free up  the defibrillator lead. Electrocautery was also utilized to free up a  portion of the subcutaneous array. It should be noted that this device had  been inserted approximately 5 cm below the previous ICD incision site.  Additional attempts to free up the subcutaneous array were unsuccessful  because of the long distance down into the lateral wall of the chest  (extrathoracic).  At this point, attention was turned to the defibrillation  lead. A stylet was inserted into the defibrillation lead and when this was  done, a small amount of purulent fluid came out of the inner coil of the  defibrillator lead. After the defibrillator lead adhesions were freed up,  the [facility] stylet was advanced into the subcutaneous lead after the  tip of the lead had  been cut. The Liberator stylet was then locked in place.  Initially the 10 1/2 [place] stainless steel sheath was advanced over the  defibrillator lead and used to enter the left subclavian vein. With this  accomplished, the [facility] electrosurgical dissection sheath was advanced over  the Liberator stylet (11 [place]). The electrocautery sheath was then  utilized to be advanced over the defibrillator lead. There was initially  fairly dense fibrous adhesions of the proximal portion of the proximal coil.  These were navigated without particular difficulty. The sheath was then used  and advanced over and into the junction between the right atrium and right  ventricle. At the distal coil, there were also adhesions. However with  gentle traction along with pressure and counter pressure, the defibrillation  lead was removed from the right ventricle. At this point, the patient  developed atrial fibrillation. Fluoroscopy did not demonstrate evidence  of  pericardial effusion. The patient's blood pressure remained stable. After  hemostasis was obtained with removal of the sheath, attention was then  turned to the removal of the subcutaneous array. A 4 cm incision was then  carried out caudal to the previous ICD pocket at the site of the old  subcutaneous incision for the subcutaneous array. Additional electrocautery  was utilized to dissect free each array lead. This was performed without  particular difficulty although there was a significant amount of time  required to free up these leads. Electrocautery was then utilized to assure  hemostasis. Copious amounts of kanamycin irrigation was utilized to irrigate  the incision and each incision was then closed with a layer of 2-0 Vicryl,  followed by a layer of 3-0 Vicryl, followed  by a layer of 4-0 Vicryl.  Benzoin is painted on the skin and Steri-Strips are applied and a pressure  dressing placed. The patient returned to the recovery room without initial  difficulty.   COMPLICATIONS:  There were no immediate pain or complications.   RESULTS:  This demonstrates successful explantation of a Guidant single  chamber defibrillator as well as removal of the Guidant active fixation  defibrillation lead and subcutaneous array. There were no immediate  procedure complications.                                               Doylene Canning. Ladona Ridgel, M.D.    GWT/MEDQ  D:  07/17/2003  T:  07/17/2003  Job:  [id number]   cc:   Barry Dienes. Eloise Harman, M.D.  [address]  [place]  [place] [address]  Fax: [phone]   Peter M. [place], M.D.  [address]  [place], [place] [address]  Fax: [phone]

## 2011-02-05 NOTE — H&P (Signed)
NAME:  Darin Vega, DIBELLO NO.:  [id number]   MEDICAL RECORD NO.:  [id number]                   PATIENT TYPE:  EMS   LOCATION:  MINO                                 FACILITY:  [facility]   PHYSICIAN:  Peter M. [place], M.D.               DATE OF BIRTH:  [birth year]   DATE OF ADMISSION:  04/13/2004  DATE OF DISCHARGE:                                HISTORY & PHYSICAL   HISTORY OF PRESENT ILLNESS:  Mr. Arline Asp is a 55 year old, white male with  history of dilated, nonischemic cardiomyopathy with an ejection fraction of  15%.  He had prior history of syncope and had an ICD implanted in 2003.  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  A new device was implanted in December 2004.  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  His ICD again was explanted and he was treated with IV  antibiotics and promptly defervesced.  His course was complicated by  hoarseness and dysphagia.  He was evaluated by both speech pathology and ENT  and was found to have a left vocal cord paralysis.  Since discharge, the  patient has done poorly.  When seen in followup on July 15, he was  hypotensive.  His diuretics were held and his Diovan and Coreg doses were  decreased.  Over the past week, his p.o. intake has been very poor and he  has had no solid food intake.  He presented to emergency room on July 20,  and was given IV fluids.  He was again seen in our office on July 21, and  given 500 cc of IV fluids on Friday, Saturday and Sunday.  Over the weekend,  however, he has had worsening nausea, vomiting and weakness and presents for  evaluation.  He has had no diarrhea, fever, chills or abdominal pain.  He  has had no shortness of breath or chest pain.   PAST MEDICAL HISTORY:  1. Dilated nonischemic  cardiomyopathy.  2. Left bundle branch block.  3. Syncope.  4. Left vocal cord paralysis.  5. Dysphagia.   MEDICATIONS:  1. Aciphex 20 mg per day.  2. Claritin 10 mg daily.  3. Pamelor 50 mg q.h.s.  4. Diovan 40 mg per day.  5. Coreg 12.5 mg b.i.d.  6. Coumadin 5 mg three days a week and 2.5 mg four days a week.  7. Bactroban 2% cream to nares b.i.d.  8. Ceftriaxone 1 g IV q.d.   SOCIAL HISTORY:  For family history and social history, please see old  chart.   REVIEW OF SYMPTOMS:  As noted in HPI, otherwise negative.   PHYSICAL EXAMINATION:  GENERAL:  Well-developed, white male who is pale and  appears weak.  VITAL SIGNS:  Blood pressure 90/69, pulse  93 in sinus rhythm, temperature  98.9, respirations 20.  HEENT:  Significant for hoarseness.  Pupils equal round and reactive to  light.  Oropharynx clear.  He has red and somewhat dry tongue without  exudate.  He has no significant JVD or bruits.  LUNGS:  Clear.  CARDIAC:  Very soft, 1/6 systolic murmur at the apex.  There is no S3.  ABDOMEN:  Soft, nontender, no hepatosplenomegaly or masses.  His prior ICD  site is healed completely.  He has no edema.  EXTREMITIES:  Cool.  NEUROLOGIC:  Intact.   LABORATORY DATA AND X-RAY FINDINGS:  ECG shows normal sinus rhythm with left  bundle branch block.   IMPRESSION:  1. Failure to thrive with nausea, vomiting, poor intake by mouth and     dehydration.  2. Dysphagia.  3. Left vocal cord paralysis.  4. Dilated nonischemic cardiomyopathy.  5. Recent methicillin-sensitive Staphylococcus aureus bacteremia, status     post implantable cardioverter-defibrillator explant.  6. Syncope.   PLAN:  1. Will need to be admitted for general IV hydration.  2. Will hold his Diovan.  3. Will continue Coreg at a lower dose, but continue to hold diuretics.  4. Will obtain medical consult and reconsult speech pathology.                                                Peter M. [place], M.D.     PMJ/MEDQ  D:  04/13/2004  T:  04/13/2004  Job:  [id number]   cc:   Barry Dienes. Eloise Harman, M.D.  [address]  [place]  [place] [address]  Fax: [phone]   Duke Salvia, M.D.   Fransisco Hertz, M.D.  [address]  [place] [address]  Fax: [phone]

## 2011-02-05 NOTE — Discharge Summary (Signed)
NAME:  Darin Vega, Darin Vega NO.:  [id number]   MEDICAL RECORD NO.:  [id number]                   PATIENT TYPE:  INP   LOCATION:  2037                                 FACILITY:  [facility]   PHYSICIAN:  Peter M. [place], M.D.               DATE OF BIRTH:  23-Aug-1956   DATE OF ADMISSION:  07/12/2003  DATE OF DISCHARGE:  07/27/2003                                 DISCHARGE SUMMARY   HISTORY OF PRESENT ILLNESS:  Mr. Darin Vega is a 55 year old white male with  known history of idiopathic dilated cardiomyopathy with ejection fraction of  15%.  He had a previous episode of syncope in February of 2003 and  subsequently underwent an AICD implant at that time.  He has been  chronically anticoagulated.  The patient presented with severe bifrontal  headache, malaise and fever.  He presented to the emergency room, at which  time a head CT was unremarkable.  He was given a dose of Rocephin  intravenously and discharged to home but the following day, the patient's  headache returned.  His appetite was decreased and he had severe high-grade  fever.  He was admitted for further evaluation.  For details of his past  medical history, social history, family history and physical exam, please  see admission history and physical.   LABORATORY DATA:  White count was 4700, hemoglobin 10.7, hematocrit 31.4,  platelets 111,000.  PT was 17.4 with an INR of 17.  Sodium 130, potassium  3.4, chloride 100, CO2 22, BUN 27, creatinine 1.7, glucose of 179, albumin  of 3.2.  AST was 45.  All other chemistries were normal.  BNP level was 299.  Urinalysis was normal.  One-out-of-two blood cultures grew Staph aureus  species that was methicillin-sensitive.   Chest x-ray showed no acute disease.   His ECG showed sinus tachycardia with left atrial enlargement and left  bundle branch block.   HOSPITAL COURSE:  The patient was admitted.  He was seen in consultation  with infectious disease and cardiology.   He was also seen initially by  neurology and it was felt that his headache was due to his infection and  sepsis.  He was initially started on vancomycin, in view of his positive  blood culture.  He subsequently defervesced.  The headache resolved.  He  felt much better.  His white count remained normal.  Subsequent blood  cultures were negative.  Blood culture identified this as a sensitive  organism and he was switched from vancomycin to Ancef.  On July 16, 2003,  he underwent a transesophageal echocardiogram.  This film showed severe left  ventricular enlargement, severe global hypokinesis and moderate mitral  insufficiency.  There were no valvular vegetations seen but there was a  vegetation noted in the right ventricular portion at site of defibrillator  lead.  Based on these findings, the patient was seen in consultation by  Dr.  Doylene Canning. Ladona Ridgel and Dr. Duke Salvia, EP team.  They recommended removal  of his defibrillator device.  There was concern that with his original  device at implant, but he had very high threshold so he required a phased  _______ to bring adequate energy delivery.  In order to try to reduce his  defibrillator threshold, we started him on Betapace; we also reduced his  Coreg dose to see if he could tolerate this.  On July 17, 2003, he had  extraction of his ICD lead, generator in subcutaneous array.  He had no  complications with this procedure.  He had a PICC line placed and he was  maintained on IV antibiotics for another week in the hospital.  During this  period of time, he had no complications, he remained afebrile and his white  count was normal.  He had no arrhythmias.  By July 26, 2003, he underwent  implantation of a new defibrillator device in the right subclavian position.  He had adequate thresholds.  Defibrillator threshold testing was successful.  His generator was a Arboriculturist, model H179.  The patient had no  subsequent  complications post defibrillator implant.  It was recommended  that he remain on IV antibiotics for a six months' course ________.  He was  resumed on his Coumadin.  On July 27, 2003, it was felt that he was  stable and safe for discharge and was discharged in stable condition.   DISCHARGE DIAGNOSES:  1. Acute Staphylococcus bacteriemia.  2. Endocarditis vegetation of defibrillator lead.  3. Dilated cardiomyopathy.  4. History of syncope.  5. Left bundle branch block.  6. Remote history of gastric disorder.   DISCHARGE MEDICATIONS:  1. Coumadin 5 mg on Monday and Friday, 2.5 mg other days.  2. Aldactone 25 mg per day.  3. Pamelor 50 mg at bedtime.  4. Lasix 40 mg per day.  5. Diovan 80 mg in the morning, 40 mg in the evening.  6. Coreg 12.5 mg twice a day.  7. Aciphex 20 mg per day.  8. Sotalol 80 mg twice a day.  9. He will continue on Ancef 2 g IV every 8 hours with home health nurse.   SPECIAL DISCHARGE INSTRUCTIONS:  The patient is discharged with a PICC line  in place.   DIET:  He is to remain on a low-salt diet.   FOLLOWUP:  Follow up with Dr. Peter M. [place] in two weeks.  Follow up with  pacer clinic at [facility], July 23, 2003, then subsequently follow up with  Dr. Graciela Husbands on July 30, 2003.  Home health nurse will check pro time on  Thursday,  August 01, 2003.   DISCHARGE STATUS:  Improved.                                                Peter M. [place], M.D.    PMJ/MEDQ  D:  09/05/2003  T:  09/05/2003  Job:  [id number]   cc:   Barry Dienes. Eloise Harman, M.D.  [address]  [place]  [place] [address]  Fax: [phone]   Duke Salvia, M.D.

## 2011-02-05 NOTE — Consult Note (Signed)
[place]. [facility]  Patient:    KENY, DONALD Visit Number: [id number] MRN: [id number]          Service Type: MED Location: [phone] Attending Physician:  [place], Peter Manning Dictated by:   Nathen May, M.D., [facility] Proc. Date: 10/30/01 Admit Date:  10/28/2001   CC:         Peter M. [place], M.D.  Barry Dienes Eloise Harman, M.D.  Electrophysiology Laboratory  [facility]   Consultation Report  Thank you very much for asking me to see Mr. Jabriel Vanduyne in an electrophysiological consultation for syncope in the setting of severe nausea and cardiomyopathy.  Mr. Bramhall is a 55 year old gentleman who has a history of nonischemic dilated myopathy diagnosed four years ago at catheterization.  He has had persistent severe depression of his LV systolic function with an EF most recently assessed in July 2002 of 15%.  He has no prior history of syncope and no history of significant palpitations.  His functional status is quite good.  He has learned to gauge his efforts.  He sleeps at 30 degrees.  He is unable to walk with his wife and his daughter in the mall; however, he exercises formally three days a week and walking for 30 minutes a day on these occasions.  He does not have peripheral edema.  On Saturday he was standing against a stage at a presentation at [facility] when he had a momentary warning of feeling hot and then next awakened on the floor. Unfortunately we have no records from the health professionals that were on the scene and no report from EMS available.  There is a comment that the patient had a thready pulse according to EMS but he was awake by the time EMS got there.  The patient has a history of syncope some 8 or 10 years before that was associated with him getting hot in a room, that episode that sounds like it may have been neurally mediated.  PAST MEDICAL HISTORY:  Notable primarily for the above.  He also has a  history of GE reflux disease, allergic rhinitis.  He takes Coumadin chronically for his nonischemic myopathy.  PAST SURGICAL HISTORY:  Negative.  MEDICATIONS:  Coumadin, Aldactone 25 q.d., Avapro 150 q.d., carbetalol 25 b.i.d., Protonix 40, and Claritin.  He also takes nortriptyline 100 q.h.s.  ALLERGIES:  He is allergic to PEANUTS but has no medical allergies.  SOCIAL HISTORY:  He works for the [employer].  He is married. He has one child.  He does not use cigarettes or alcohol and denies use of recreational drugs.  REVIEW OF SYSTEMS:  Noncontributory.  PHYSICAL EXAMINATION:  GENERAL:  He is a middle-aged Caucasian male appearing in no acute distress.  VITAL SIGNS:  Blood pressure 133/55.  His pulse is 74 and regular.  HEENT:  Demonstrates no scleral icterus nor xanthomata.  He is quite myopic.  NECK:  Neck veins were flat.  His carotids were brisk but diminished bilaterally without bruits.  BACK:  Without kyphosis or scoliosis.  LUNGS:  Clear.  HEART:  His PMI was displaced.  S1 was diminished.  S2 was normal.  No murmurs were appreciated though Dr. Swaziland had appreciated one earlier.  There was no S3.  ABDOMEN:  Soft with active bowel sounds.  There was no hepatomegaly.  Abdomen was quite protuberant.  No midline pulsation could be appreciated.  Femoral pulses were 2+.  Distal pulses were intact and there  was no cyanosis, clubbing, or edema.  NEUROLOGICAL:  Exam was grossly normal.  SKIN:  Warm and dry.  ANCILLARY DATA:  Electrocardiogram demonstrated sinus rhythm at 68 with intervals of 0.17/0.18/0.45.  There is left bundle branch block.  IMPRESSION: 1. Syncope. 2. Nonischemic cardiomyopathy with an EF of 15-20%. 3. Left bundle branch block. 4. 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 5. Coumadin for #2. 6. Gastroesophageal reflux disease.  Mr. Heinzman had syncope with very little warning in the setting of severe nonischemic cardiomyopathy.  This is  quite concerning for an arrhythmic episode by history.  Electrophysiological testing, however, is notoriously poor in either positively or negatively predicting outcomes in patients with nonischemic myopathy with syncope.  It is very useful in identifying whether the patient has bundle branch reentry which could certainly could have caused the syncopal episode; however, it is noted by recent studies that these patients have a persistent risk of life-threatening arrhythmias and ICD implantation is nonetheless concomitantly recommended.  ICD discharges in a patient with nonischemic myopathy and syncope is similar to the ICD discharge rate in patients with prior VTVS.  His functional status is really quite good; however, I think he has gauged his effort to minimize his symptoms.  As he is not able to walk with his wife and he sleeps at 30 degrees, a quantitative assessment I think is appropriate to decide whether biventricular pacing at the time of ICD implantation would be of benefit.  Based on the above, therefore, I would recommend: 1. Holding his Coumadin. 2. Proceeding with ICD implantation for the normalization of his INR. 3. Six-minute walk test to quantitate functional capacity. 4. No driving x 6 months.  I have reviewed this with the patient.  His wife is out of town and will be for the remainder of this calendar week.  Tentatively, we will plan to undertake the aforementioned implant on Thursday.  Thank you very much for this consultation. Dictated by:   Nathen May, M.D., [facility] Attending Physician:  [place], Peter Manning DD:  10/30/01 TD:  10/30/01 Job: [id number]

## 2011-02-05 NOTE — Discharge Summary (Signed)
Darin Vega. [facility]  Patient:    Darin Vega, Darin Vega Visit Number: [id number] MRN: [id number]          Service Type: MED Location: [id number] Attending Physician:  [place], Peter Manning Dictated by:   Peter M. [place], M.D. Admit Date:  10/28/2001 Discharge Date: 11/03/2001   CC:         Barry Dienes. Eloise Harman, M.D.  Nathen May, M.D., [facility]   Discharge Summary  HISTORY OF PRESENT ILLNESS:  Darin Vega is a 55 year old, white male with a history of nonischemic dilated cardiomyopathy.  This was initially diagnosed four years ago.  He had cardiac catheterization which showed normal coronary anatomy.  His left ventricular function has been severely impaired with ejection fraction of 15%.  This has persisted despite optimal medical therapy. He has no prior history of syncope or palpitations.  His functional status has been quite good on medical therapy.  He has been able to exercise formally three days a week and walks on the other days.  He has had no edema.  On the day of admission, the patient was standing during a presentation when he suddenly had a syncopal episode without significant warning symptoms. Apparently, he had a thready pulse initially, but was awake by the time EMS got there.  The patient was admitted for further evaluation.  For details of his past medical history, social history, family history and physical exam, please see admission History and Physical.  LABORATORY DATA AND X-RAY FINDINGS:  Chest x-ray showed marked cardiomegaly, but no active disease.  White count 5900, hemoglobin 13.5, hematocrit 39.1, platelets 176,000.  PT 23.1 with an INR of 2.6.  Sodium 137, potassium 3.7, chloride 108, CO2 27, glucose of 93, BUN 15, creatinine 1.0.  All other chemistries were normal.  CK was 67 with negative MB with troponin 0.02.  ECG showed normal sinus rhythm with left bundle branch block which was chronic.  HOSPITAL COURSE:  The  patient was admitted.  He ruled out for myocardial infarction.  During his initial evaluation to the emergency room, he was noted to have occasional PVCs.  He had no further arrhythmias during his hospital stay.  An echocardiogram was obtained which demonstrated marked left ventricular enlargement with end-diastolic volume of 74 mm.  He had severe global hypokinesia.  He had moderate mitral insufficiency, moderate left atrial enlargement.  There was no intracardiac thrombus.  This was felt to be unchanged from his prior echocardiogram study from July 2002.  The patient was seen in consultation by Dr. Sherryl Manges.  Given his onset of syncope in the setting of severe, nonischemic cardiomyopathy, it was felt that the patient should have a defibrillator placed.  He was tested in the hospital and actually had a very good exercise tolerance with a six minute exercise tolerance of greater than 400 m.  Based on these results, it was felt that he did not require biventricular pacemaker.  On September 01, 2002, the patient did undergo an implantation of an AICD. This was a Ventak prism model No. 1860.  Because of high defibrillation threshold, he also had a subcu array placed.  Initially, this demonstrated high defibrillation thresholds of only one out of two successful defibrillations.  At this point, the procedure was halted.  The patient was brought back the following day and had defibrillation thresholds again.  This demonstrated defibrillation threshold less than or equal to 315 joules.  This was felt that he had an adequate safety  margin.  He had no postoperative complications and was discharged home on November 03, 2001, in stable condition.  DISCHARGE DIAGNOSES: 1. Syncope. 2. Severe nonischemic dilated cardiomyopathy. 3. Left bundle branch block chronic. 4. Chronic anticoagulation.  SPECIAL INSTRUCTIONS:  The patients Coumadin was held on admission.  Prior to his procedure, his INR  was 1.3.  His Coumadin will be resumed on discharge.  DISCHARGE MEDICATIONS: 1. Coumadin 5 mg on Tuesday, Wednesday and Saturday; 2.5 mg other days. 2. Spironolactone 25 mg daily. 3. Diovan 80 mg per day. 4. Coreg 25 mg b.i.d. 5. Allegra 60 mg b.i.d. 6. Prilosec 20 mg q.h.s. 7. Nortriptyline 100 mg q.h.s.  FOLLOWUP:  The patient will have a followup with Dr. Graciela Husbands in 10 days.  He will follow up with Dr. Swaziland in two weeks.  CONDITION ON DISCHARGE:  Improved. Dictated by:   Peter M. [place], M.D. Attending Physician:  [place], Peter Manning DD:  11/03/01 TD:  11/04/01 Job: [id number]

## 2011-02-05 NOTE — Consult Note (Signed)
NAME:  Darin Vega NO.:  [id number]   MEDICAL RECORD NO.:  [id number]                   PATIENT TYPE:  EMS   LOCATION:  MAJO                                 FACILITY:  [facility]   PHYSICIAN:  Peter M. [place]and, M.D.               DATE OF BIRTH:  [birth year]   DATE OF CONSULTATION:  03/13/2004  DATE OF DISCHARGE:                                   CONSULTATION   ATTENDING PHYSICIAN:  Gaspar Garbe, M.D.   Darin Vega is a 55 year old white man who is admitted to [facility] for further evaluation and management of fever, chills,  leukocytosis, and hypotension.   The patient has a history of idiopathic dilated cardiomyopathy with an  ejection fraction in the range of 15%.  In 2003 an AICD was implanted after  an episode of syncope.  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   In recent weeks the patient has been feeling more dyspneic and more  fatigued.  He saw Dr. Sw[place]iland approximately one week ago, and his dose of  diuretic was increased.  He has not experienced any chest pain, tightness,  heaviness, pressure, or squeezing, nor has he noted any edema or weight  gain.  Tonight the patient experienced the relatively abrupt onset of  chills.  Temperature at home was recorded to be 101.7.  He felt particularly  weak and fatigued.  He was brought to the emergency department, where he was  noted to be hypotensive and febrile.  It should be noted that the patient's  blood pressure at baseline is between 90 and 95.   The patient's past medical history is notable for gastroesophageal reflux as  well as peptic ulcer disease.   The patient is on a number of medications for the aforementioned problems.  These include Coumadin, spironolactone, Diovan, Coreg, furosemide, Sotalol,  Aciphex, nortriptyline, Clarinex, and Alocril eye drops.   ALLERGIES:  PENICILLIN  but not cephalosporins, and PEANUT OIL.  In addition,  he is _________.   PREVIOUS OPERATIONS:  None other than as listed above.   SOCIAL HISTORY:  The patient is married and works as a [employer] manager.  There is no history of alcohol or tobacco abuse.   FAMILY HISTORY:  Coronary artery disease in his mother and nephrolithiasis  in his brother.   Review of systems reveals no new problems related to his head, eyes, nose,  mouth, throat, lungs, gastrointestinal system, genitourinary system, or  extremities.  There is no history of neuropsychiatric disorder.  There is no  history of weight loss.  The patient has been eating __________.   PHYSICAL EXAMINATION:  VITAL SIGNS:  Blood pressure 84/41.  Pulse 83 and  regular.  Respirations 22.  Temperature was 103.6 earlier in the emergency  department; it is now 100.4.  Pulse oximetry 100% on 2 L.  GENERAL:  The patient was a middle-aged white man who appeared to be sweaty  but otherwise comfortable and in no respiratory distress.  He was alert,  oriented, appropriate, and responsive.  HEENT:  Head, eyes, nose, and mouth were normal.  NECK:  Without thyromegaly or adenopathy.  Carotid pulses were palpable  bilaterally.  No jugular venous distention was noted.  CARDIAC:  A regular rhythm.  There was no murmur, rub, or click.  CHEST:  No chest wall tenderness was noted.  The lungs were clear.  ABDOMEN:  Soft and nontender.  There was no mass, hepatosplenomegaly, bruit,  distention, rebound, guarding, or rigidity.  Bowel sounds were normal.  RECTAL, GENITAL:  Examinations were not performed as they were not pertinent  to the reason for acute care hospitalization.  EXTREMITIES:  Without edema, deviation, or deformity.  Radial and dorsalis  pedal pulses were palpable.  NEUROLOGIC:  Brief screening neurologic survey was unremarkable.   The chest radiograph, according to the radiologist, demonstrated cardiac  enlargement and vascular congestion.   The electrocardiogram revealed left  atrial enlargement and left bundle branch block.  White count was 14.0 with  a hemoglobin of 14.5 and a hematocrit of 42.4.  Potassium was 3.6, BUN 25,  and creatinine 1.6.  INR was 3.3.  The remaining studies were pending at the  time of this dictation.   IMPRESSION:  1. Fever, chills, leukocytosis, hypotension.  Rule out sepsis.  Rule out     recurrent automatic implantable cardioverter-defibrillator lead     endocarditis.  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  2. Hypotension.  Probably due to a combination of volume depletion,     decreased systemic vascular resistance, and left ventricular function.     Baseline systolic blood pressure runs approximately 95.  3. Idiopathic dilated cardiomyopathy.  4. Automatic implantable cardioverter-defibrillator.  5. Gastroesophageal reflux; history of peptic ulcer disease.   RECOMMENDATIONS:  1. Coronary care unit.  2. Blood cultures.  3. Sepsis antibiotic coverage.  4. Judicious fluids, watching for problems of worsening congestive heart     failure.  5. Dopamine for pressure support; a systolic blood pressure of 90 will be     satisfactory.  6. Hold antihypertensive medications.  7. Transesophageal echocardiogram.  8. Serial cardiac enzymes.  9. Dr. [place]and will follow with you.     [name]. [name], MD                   Peter M. [place], M.D.    MSC/MEDQ  D:  03/13/2004  T:  03/13/2004  Job:  [id number]   cc:   Gaspar Garbe, M.D.  [address]  [place]  [place] [address]  Fax: [phone]   [name]. [name], MD  [address]  [place], [place] [address]  Fax: [phone]

## 2011-02-05 NOTE — Discharge Summary (Signed)
NAME:  Darin Vega, Darin Vega NO.:  [id number]   MEDICAL RECORD NO.:  [id number]                   PATIENT TYPE:  INP   LOCATION:  2908                                 FACILITY:  [facility]   PHYSICIAN:  Peter M. [place], M.D.               DATE OF BIRTH:  [birth year]   DATE OF ADMISSION:  04/13/2004  DATE OF DISCHARGE:  04/20/2004                                 DISCHARGE SUMMARY   HISTORY OF PRESENT ILLNESS:  Mr. Debes is a 55 year old, white male with  history of a dilated, nonischemic cardiomyopathy.  He was initially  diagnosed in December 1999.  Cardiac catheterization at that time showed an  ejection fraction of 15-20% with severe global hypokinesia and markedly  enlarged left ventricular size.  The coronary anatomy was normal.  Right  heart pressures at that time were normal.  The patient was treated  medically.  He actually did quite well over the past 5 years.  He had only  Class II symptoms and was able to continue working full-time for the  [employer].  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  He was evaluated at [facility] in 2001, and they recommended continued medical therapy at that  time.  In 2003, he had an episode of syncope.  There was no documented  arrhythmia at that time, but he did have an ICD implanted.  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  He was treated with IV antibiotics and a new device was implanted in  December 2004.  It was noted on both of his defibrillator placements that he  had very high thresholds.  On his initial evaluation, he had a phased array  placed in the left chest and this was also explanted.  With his last device,  he was started on Beta-pace to try and lower his defibrillation thresholds.  He did well with this, but subsequently developed symptoms of low blood  pressure and his Beta-pace was  discontinued and we also had to reduce the  dose of his ARB and Coreg.  He remained symptomatically stable until March 12, 2004, when he was admitted with acute sepsis with high fevers and  hypotension.  He once again developed methicillin-sensitive Staphylococcus  aureus bacteremia.  His transthoracic echocardiogram and transesophageal  echocardiogram at that time showed no evidence of vegetation, but give his  history, it was felt that his ICD again would need to be taken out and this  was explanted.  He defervesced and stabilized on medication.  It was  recommended by infectious disease, that he be treated with at least 6 weeks  of IV antibiotics and then would consider another 6 weeks of oral  antibiotics.  He is currently receiving Rocephin 1 g IV daily.  The patient  was discharged after  that admission in stable condition.  However,  subsequent followup visits demonstrated that he was persistently  hypotensive.  His Diovan and Coreg doses were decreased and his diuretics  were held.  We attempted to hydrate him with IV fluids as an outpatient, but  he continued to decline with progressive nausea, vomiting, weakness and  lightheadedness.  He had no recurrent diarrhea, fever, chills or abdominal  pain.  He was readmitted on April 13, 2004, for further management.  At that  time, he was noted to have elevated liver function studies and elevation of  his renal indices.  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  He also  developed difficulty swallowing.  This swallowing difficulty worsened as his  overall status worsened.  For details of his past medical history, social  history and family history, please see admission History and Physical.   LABORATORY DATA AND X-RAY FINDINGS:  White count 6900, hemoglobin 13.6,  hematocrit 42.8, platelets 233,00.  PT 20.9 with an INR of 2.3 on Coumadin,  PTT 35.  Sodium  140, potassium 4.7, chloride 106, CO2 25, BUN 54, creatinine  2.0, glucose 121.  Total protein 6.2 with an albumin of 3.2, AST 108, ALT  115, total bilirubin 1.2.  Amylase 55, lipase 74.  Lactic acid was elevated  at 2.9.  B-type natriuretic peptide levels 1601.  TSH was 4.195.  Hepatitis  B surface antigen was negative.  Hepatitis B surface antibody was positive.  Hepatitis C antibody was negative.  Cortisol level was normal at 17.3.  PTH  level is pending.  Urinalysis was benign.  Urine culture was negative.   Chest x-ray showed marked cardiomegaly without active disease.  ECG showed  normal sinus rhythm with a left bundle branch block.   HOSPITAL COURSE:  The patient was admitted initially to telemetry.  He was  seen by medicine consultation and GI.  He was also evaluated by speech  pathology.  There was no obvious reason for his persistent nausea and  vomiting.  Abdominal ultrasound showed no hydronephrosis and normal renal  size.  He had sludge in his gallbladder, but no evidence of obstruction.  He  remained persistently hypotensive requiring holding all his cardiac  medications.  With no other explanations, it was felt that his symptoms were  predominantly due to low cardiac output.  He was not overtly volume  overloaded.  He was transferred to our stepdown unit and placed on IV  inotropes with dopamine and IV Primacor.  After initially beginning this  therapy, he noted a significant symptomatic improvement with resolution of  his nausea.  His dysphagia improved.  Followup with speech pathology with  modified barium swallow showed no evidence of aspiration and significant  improvement in his swallowing function.  His renal indices improved.  His  BUN and creatinine peaked at 55 and 2.3 respectively.  At the time of  discharge, his renal indices had improved with a BUN of 19, creatinine 1.3.  His liver functions also improved with a peak AST of 295 and ALT of 263, but by the time of  discharge, his AST had dropped to 65 and ALT of 111.  Bilirubin peaked at 1.7 and was 1.5 at the time of discharge.  Also, after  starting inotropic therapy, his BNP declined to 767.  Additional laboratory  evaluation included an echocardiogram early on admission which showed severe  left ventricular enlargements  with severe global hypokinesia and markedly  reduced LV function with ejection fraction of 15%.  There was akinesia of  the entire anterior septal wall.  There was also severe mitral  insufficiency.  The left atrium was moderately enlarged.  Right ventricular  systolic pressure was estimated at upper limits of normal.  He had mild to  moderate tricuspid insufficiency.  Compared to his prior study in June, it  was felt that left ventricular and left atrial size had increased and mitral  insufficiency was more pronounced.  He also had an MRI of the head and neck.  This showed no cervical or pharyngeal mass.  There was paralysis of the  right vocal cord.  There was extensive calcification of the brain and there  was some concern about hyperparathyroidism.  His calcium levels were normal  and PTH was pending at the time of discharge.  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  While the patient initially did improve  with IV inotropic therapy over the weekend from July 30, through April 19, 2004, he had return of his nausea and vomiting and hypoperfusion with cool,  clammy extremities.  He remained hypotensive requiring increasing doses of  IV dopamine.  Given his poor clinical response, it was felt that he  warranted evaluation at tertiary care center and plans would be made to  transfer the patient to [facility] to be evaluated at  their Heart Failure Clinic there.  At the time of discharge, the patient was  hemodynamically stable and had lab work as listed above.   DISCHARGE DIAGNOSES:  1. Severe dilated cardiomyopathy, nonischemic.  2.  Low cardiac output symptoms with renal insufficiency, elevated liver     function studies, persistent nausea and vomiting.  3. Chronic anticoagulation.  4. Left bundle branch block.  5. Right vocal cord paralysis.  6. History of prior syncope.  7. Dysphagia.   CURRENT MEDICATIONS:  1. Rocephin 1 g IV which is scheduled to be completed this coming Thursday.  2. Pamelor 50 mg per day.  3. Warfarin is being monitored by pharmacy on a daily basis.  4. Multivitamin daily.  5. Protonix 40 mg a day.  6. Rocephin 1 g IV.  7. Zofran 4 mg IV q.6h.  8. Dopamine drip currently at 14 mcg/kg per minute.  9. Milrinone drip at 0.1 mcg/kg per minute.   SPECIAL INSTRUCTIONS:  Further therapy will be per [facility].                                                Peter M. Sw[place]il[place], M.D.    PMJ/MEDQ  D:  04/20/2004  T:  04/20/2004  Job:  [id number]   cc:   Barry Dienes. Eloise Harman, M.D.  [address]  [place]  [place] [address]  Fax: [phone]  Duke Salvia, M.D.   Jefry H. Pollyann Kennedy, M.D.  [address] [place]  [place] [address]  Fax: [phone]   Petra Kuba, M.D.  [address]  [place]  [place] [address]  Fax: [phone]

## 2011-02-05 NOTE — Op Note (Signed)
NAME:  Darin Vega, Darin Vega                          ACCOUNT NO.:  [id number]   MEDICAL RECORD NO.:  [id number]                   PATIENT TYPE:  INP   LOCATION:  2037                                 FACILITY:  [facility]   PHYSICIAN:  Duke Salvia, M.D.               DATE OF BIRTH:  1956-07-26   DATE OF PROCEDURE:  07/26/2003  DATE OF DISCHARGE:                                 OPERATIVE REPORT   PREOPERATIVE DIAGNOSIS:  Syncope in the setting of ischemic cardiomyopathy  and class 2 congestive failure.   POSTOPERATIVE DIAGNOSIS:  Syncope in the setting of ischemic cardiomyopathy  and class 2 congestive failure.   PROCEDURE:  Defibrillator implantation with intraoperative  defibrillation  threshold testing including  left subclavian venography and placing of a  left subclavian  defibrillator lead.   DESCRIPTION OF PROCEDURE:  Upon obtaining informed consent the patient was  brought to the catheterization laboratory and placed on the fluoroscopic  table in the supine position. After routine prep and drape of the right  upper  chest, Lidocaine was infiltrated in the prepectoral subclavicular  region.   An incision was made and carried down to the layer of the prepectoral fascia  using sharp dissection and electrocautery. A pocket was form similarly.  Hemostasis was achieved.   Thereafter, attention was turned to gaining access of the extrathoracic  right subclavian vein which was accomplished without difficulty. It was  notable, however, that it was difficult to pass the wire down the superior  vena cava, so the 03 guide wire was exchanged for a glide wire. This then  passed easily into the inferior vena cava. A 2nd venipuncture was  accomplished as well.   Over  the 03 guide wire a 9 French  tear-away introducer sheath was placed  through which was then passed a Guidant serial number [id number] dual coil  defibrillator lead, serial number [id number]. Under fluoroscopic guidance it was  manipulated into the right ventricular apex where the bipolar R-wave was  11.4 millivolts with a pacing impedance of 485 ohms and a pacing threshold  of 0.5 volts at 0.5 milliseconds. Current of threshold was 1.1 MA and there  was no diaphragmatic pacing at 10 volts.   This lead was then secured to the prepectoral fascia. Over the glide wire  then a short SR-4 coronary catheter was used to cannulate the left  innominate and then the left subclavian vein. A guide wire was placed and  venography was attained demonstrating a valve but no evidence of clot. We  then advanced  the wire past the valve, took another picture again,  notwithstanding the fact that there was a significant amount of streaming,  the venogram was reviewed with Dr. Susa Griffins, and it appeared  to  both of [place] that there was no significant clot there.   With this then having been accomplished, an Amplatz stiff wire was  maneuvered throughout the sheath, allowing then a 9 French tear-away  introducer sheath to be placed across into the left subclavian vein. Through  this was then passed a Medtronic [id number], 58-cm  defibrillator coil, serial  number [id number]. This lead was then secured to the prepectoral fascia and  these leads were then attached in concert to a Guidant contact renewal HE  defibrillator model H179, serial number [id number]. This  defibrillator was used  because of its short charge time. The atrial port was plugged. The left  ventricular port was plugged. The proximal coil of the  defibrillator lead  was capped. The distal coil was then placed into the distal port. The left  subclavian vein coil was placed into the proximal port, and through the  device, the bipolar R-wave was 11.5 millivolts with a pacing impendence of  607 ohms, a pacing threshold of 0.2 volts at 0.5 milliseconds with a high  voltage impendence of 46 ohms.   The pocket was copiously irrigated with antibiotic containing solution and   the leads and the pulse generator were then placed in the pocket.  Ventricular fibrillation was induced using a T-wave shock. After a total  duration of 9.5 seconds, a 32 joule shock was delivered through a measured  resistance of 45 ohms, turning in ventricular fibrillation and restoring  sinus rhythm.   After a wait of 5 to 6 minutes, ventricular fibrillation was reintroduced  via the T-wave shock. After a total duration of 9.5 seconds, a 31 joule  shock was delivered through a measured resistance of 45 ohms, turning in  ventricular fibrillation and restoring ventricular rhythm which was atrial  fibrillation. A 200 joule shock was delivered transcutaneously with a  biphasic wave form synchronously with a QRS, restoring sinus rhythm.   At this point the system was implanted. The pocket was again copiously  irrigated with antibiotic containing saline solution. Hemostasis was assured  and the device was secured to the prepectoral fascia. The wound was then  closed in 3 layers in the normal fashion. The wound was washed, dried and a  Benzoin and Steri-Strip dressing was applied.   All sponge, instrument and needle counts were correct at the end of the  procedure according to the staff. The patient tolerated the procedure  without apparent complications.                                               Duke Salvia, M.D.    SCK/MEDQ  D:  07/26/2003  T:  07/27/2003  Job:  [id number]   cc:   Peter M. [place]il[place], M.D.  [address]  [place], [place] [address]  Fax: [phone]   Barry Dienes. Eloise Harman, M.D.  [address]  [place]  [place] [address]  Fax: [phone]   Electrophysiology Lab   [facility]

## 2011-02-05 NOTE — Consult Note (Signed)
NAME:  Darin Vega, Darin Vega                          ACCOUNT NO.:  [id number]   MEDICAL RECORD NO.:  [id number]                   PATIENT TYPE:  INP   LOCATION:  0450                                 FACILITY:  [facility]   PHYSICIAN:  Colleen Can. Deborah Chalk, M.D.            DATE OF BIRTH:  1955-12-10   DATE OF CONSULTATION:  DATE OF DISCHARGE:                                   CONSULTATION   Thank you very much for asking [place] to see Darin Vega.  He presents with fever  and headache.  He was seen and evaluated and had positive blood cultures  that appear to be Staph in nature.  He has a complex medical history but  more importantly, he has indwelling hardware with an ICD implanted in his  left chest.  He has had no particular tenderness or no cardiac problems that  he notes.  There has been no preceding skin infections or other medical  procedures or other illnesses that would suggest a source of this bacteremia  and presumed sepsis.   PAST MEDICAL HISTORY:  1. 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  2. He is on chronic Coumadin anticoagulation.  3. Left bundle-branch block.  4. History of an ICD from February 2003 (18 months ago).  5. Remote gastric ulcer.  6. Gastroesophageal reflux.  7. History of migraines.   ALLERGIES:  1. PENICILLIN.  2. ACE INHIBITORS create a cough.  3. He has ANAPHYLAXIS related to PEANUTS.   CURRENT MEDICINES:  1. Coumadin.  2. Lasix.  3. Coreg 25 mg b.i.d.  4. Diovan 80 mg in a.m. and 40 p.m.  5. Aldactone 25 mg daily.  6. Prilosec 20 b.i.d.  7. Allegra.   FAMILY HISTORY:  Noncontributory.   SOCIAL HISTORY:  He is married.  There is no alcohol, no smoking.  He works  as a Midwife.  There is no illicit drug use.   REVIEW OF SYSTEMS:  Basically unremarkable.  He has had a history of  headache.   PHYSICAL EXAMINATION:  VITAL SIGNS:  Temperature 101, blood pressure 90/60.  SKIN:  Warm and dry.  Color is normal.  NECK:  Supple.  LUNGS:  Reasonably clear, and ICD is in place in left pectoral region.  There is no tenderness or erythema.  HEART:  Regular rate and rhythm.  ABDOMEN:  Soft.  EXTREMITIES:  Without edema.  The skin is free of soft tissue lesions.   LABORATORY DATA:  White count is 11,500, hematocrit 34, platelets 136,000.  Chemistries are remarkable for potassium 3.4, BUN 27, creatinine 1.7,  glucose 179.  INR is 1.7.  Blood cultures x 2 are positive for Staph aureus.   OVERALL IMPRESSION:  1. Staphylococcus bacteremia in the setting of an implantable AICD.  2. Dilated cardiomyopathy with a known ejection fraction of 15%.  3. Chronic Coumadin anticoagulation.  4. Left bundle-branch block.  5.  Gastroesophageal reflux disease.   DISCUSSION:  This is a very complex situation, especially in light of the  Staph aureus bacteremia.  I agree with the antibiotics over the weekend.  We  will proceed with a TEE next week, although that may or may not be helpful.  We need to presume that he does have an infection on his defibrillator or  hardware.  It is quite a dilemma regarding management, but probably the  hardware needs to be removed.  Antibiotics probably will not result in  clearing of the presumed source of infection unless the possibility of  another site is located, and that would be unusual with the bacteremia. It  may be that he will need to be referred to a special sitter for removal of  his device and subsequent reimplantation.  We will continue to monitor and  make sure we do not develop any new murmurs or other consequences of  potential abscess formation with the staph over the weekend and then proceed  on with the TEE at the early part of the week.                                               Colleen Can. Deborah Chalk, M.D.    SNT/MEDQ  D:  07/14/2003  T:  07/14/2003  Job:  [id number]   cc:   Barry Dienes. Eloise Harman, M.D.  [address]  [place]  [place] [address]  Fax: [phone]   Peter M. [place],  M.D.  [address]  [place], [place] [address]  Fax: [phone]

## 2011-02-05 NOTE — Op Note (Signed)
NAME:  Darin Vega, Darin Vega                          ACCOUNT NO.:  [id number]   MEDICAL RECORD NO.:  [id number]                   PATIENT TYPE:  INP   LOCATION:  3311                                 FACILITY:  [facility]   PHYSICIAN:  Doylene Canning. Ladona Ridgel, M.D.               DATE OF BIRTH:  02/07/1956   DATE OF PROCEDURE:  03/18/2004  DATE OF DISCHARGE:                                 OPERATIVE REPORT   PROCEDURE:  ICD lead and generator extraction.   INDICATIONS FOR PROCEDURE:  Staph sepsis.   INTRODUCTION:  The patient is a 55 year old male with a nonischemic  cardiomyopathy and syncope and left bundle branch block.  He is status post  ICD insertion.  He was admitted to the hospital after having fevers and  chills and growing out Staph in his blood stream. He has been treated with  IV antibiotics.  He is now for ICD lead and generator removal.   DESCRIPTION OF PROCEDURE:  After informed consent was obtained, the patient  was taken to the diagnostic EP lab in the fasting state.  After the usual  preparation and draping, intravenous Fentanyl and midazolam were given for  sedation.  A total of 30 mL of lidocaine was infiltrated into the right  infraclavicular region.  A 9 cm incision was carried out over this region  and electrocautery utilized to dissect down to the fascial plane and into  the ICD pocket.  The generator was removed with gentle traction.  Electrocautery was utilized to free up the dense fibrous adhesions  surrounding the defibrillator lead and coil.  The initial extraction attempt  was directed toward the defibrillator coil in the left innominate vein.  A  stiff stylus was advanced into this active coil and removed under gentle  traction.  With the extra defibrillator coil removed, attention was then  turned at the defibrillator lead.  This was a Guidant active fixation 9  [place] defibrillation lead.  The active fixation mechanism was withdrawn.  This along with counter clockwise  torquing of the lead resulted in freeing  up of the lead and at the distal tip of the heart from the right ventricular  lead interface.  However, there were dense fibrous adhesions at the site of  the proximal coil which was at the junction of the superior vena cava and  SVC.  Initial attempts at gentle traction in this region were unsuccessful  in removing the lead.  At this point the Liberator locking stylet was  advanced into the lead and again gentle traction was carried out with no  ability to remove the lead.  At this point, the [facility] stainless steel dilator  was used and advanced over the defibrillation lead into the right subclavian  vein.  With access to the vein established, the stainless steel sheaths were  removed and the 10 French polypropylene sheath was advanced over the lead  and  a combination of traction and counter traction, pressure and counter  pressure were utilized which ultimately resulted in freeing up of the  proximal coil on the defibrillator lead from it's dense fibrous adhesions on  the vein itself.  At this point, the lead was withdrawn out of the vascular  space under gentle traction utilizing fluoroscopic guidance.  Pressure was  held and a silk suture placed to assure hemostasis.  At this point, the  pocket was irrigated with kanamycin and the incision was closed with a layer  of 2-0 Vicryl followed by a layer of 3-0 Vicryl.  Benzoin was painted on the  skin, Steri-Strips were applied, and a pressure dressing placed.  The  patient was returned to his room in satisfactory condition.   COMPLICATIONS:  There were no immediate procedure complications.   RESULTS:  This demonstrates successful ICD lead and generator extraction in  a patient with prior eight months device who presented with Staphylococcus  sepsis.                                               Doylene Canning. Ladona Ridgel, M.D.    GWT/MEDQ  D:  03/18/2004  T:  03/18/2004  Job:  [id number]   cc:   Peter M.  [place], M.D.  [address], [address]  [place], [place] [address]  Fax: [phone]   Duke Salvia, M.D.

## 2011-02-05 NOTE — H&P (Signed)
NAME:  Darin Vega, HACKBART                          ACCOUNT NO.:  [id number]   MEDICAL RECORD NO.:  [id number]                   PATIENT TYPE:  INP   LOCATION:  0450                                 FACILITY:  [facility]   PHYSICIAN:  Barry Dienes. Eloise Harman, M.D.            DATE OF BIRTH:  1956-09-03   DATE OF ADMISSION:  07/12/2003  DATE OF DISCHARGE:                                HISTORY & PHYSICAL   CHIEF COMPLAINT:  Fever and headaches.   HISTORY OF PRESENT ILLNESS:  The patient is a 55 year old white male, who  yesterday developed a moderately severe bifrontal headache with malaise and  fever.  He was seen in the emergency room at [facility] with labs and a  head CT scan unremarkable.  He was given a dose of Rocephin intravenously  and discharged to home with plans for a reevaluation by myself the next  morning.  At his follow-up evaluation today in our office, his headache had  improved to 2/10 intensity from an initial intensity of 8/10.  His appetite  was somewhat decreased, but he denied nausea, vomiting, sinus pain, sore  throat, productive cough, or dysuria.   PAST MEDICAL HISTORY:  1. Idiopathic dilated cardiomyopathy with a left ventricular ejection     fraction of approximately 15%, left bundle-branch block with AICD     placement in February 2003 after an episode of syncope.  2. Anticoagulation.  3. Migraine headaches.  4. Colon polyp.  5. In 1987, peptic ulcer disease requiring a transfusion.  6. Seasonal allergic rhinitis.  7. Gastroesophageal reflux disease.  8. Nephrolithiasis.   MEDICATIONS PRIOR TO ADMISSION:  1. Coumadin 5 mg daily x 2 days weekly and 2.5 mg daily x 5 days weekly.  2. Aldactone 25 mg daily.  3. Pamelor 50 mg p.o. q.h.s.  4. Lasix 40 mg daily.  5. Alocril eye drops p.r.n.  6. Diovan 80 mg every a.m. and 40 mg every p.m.  7. Coreg 25 mg b.i.d.  8. Clarinex 5 mg daily.  9. Aciphex 20 mg daily.   ALLERGIES:  1. PENICILLIN but not  cephalosporins.  2. PEANUT OIL.  3. ACE INHIBITORS have been associated with a dry cough.   PAST SURGICAL HISTORY:  1. November 1999, cardiac catheterization showing no evidence of coronary     artery disease.  2. February 2003, AICD placement.   FAMILY HISTORY:  His father is in his 80s and alive and well.  His mother is  in her late 36s and has had problems with coronary artery disease and  depression.  A brother in his 89s has also had nephrolithiasis.  There is no  family history of close relatives with diabetes mellitus or colon cancer.  He has one daughter in her teens who is without significant medical  problems.   SOCIAL HISTORY:  He is married and works as a Lawyer.  He had no  history of tobacco or alcohol abuse.   REVIEW OF SYSTEMS:  Positive for a fever and intermittent moderately severe  epigastric pain.  He denies change in his vision, chest pain, or  palpitations, dyspnea on exertion or productive cough, constipation,  diarrhea, symptoms of benign prostatic hypertrophy, dysuria, anxiety, or  depression.   INITIAL PHYSICAL EXAMINATION:  VITAL SIGNS:  Blood pressure 90/60, pulse 80,  respirations 20, temperature 100.5 degrees F.  GENERAL:  He is a well-nourished, well-developed, white male, who is mildly  delirious and very fatigued-appearing.  HEENT:  Pupils are equal, round, and reactive to light and accommodation.  Extraocular movements are intact.  NECK:  Supple with  full range of motion and did not show jugular venous  distension or carotid bruit.  CHEST:  Clear to auscultation.  HEART:  Regular rate and rhythm.  S1 and S2 are present without murmur,  gallop, or rub.  ABDOMEN:  Normal bowel sound with no hepatosplenomegaly or tenderness.  EXTREMITIES:  Without cyanosis, clubbing, or edema.  There were no Osler's  nodes or Janeway lesions.  NEUROLOGIC:  He was sleepy but answered questions well.  He was oriented x  3. Cranial nerves 2-12  were normal.  He had normal deep tendon reflexes,  sensation, and muscle strength.  He had mild lightheadedness with gait.   LABORATORY STUDIES:  White blood cell count 7.9, hemoglobin 12, hematocrit  35, platelet count 127.  CT scan of the head (noncontrast) showed prominent  calcification of the choroid plexus, but was otherwise unremarkable.  A  chest x-ray showed no acute cardiopulmonary disease.  He had mild  cardiomegaly with pacemaker leads in the expected location.   IMPRESSION AND PLAN:  1. Fever and headache:  This is most likely due to a viral syndrome or a     sepsis syndrome.  The viral syndrome is favored by his relatively benign     exam and low white blood cell count.  He has not had the exposure history     to suggest ehrlichiosis, but his symptoms of fever, headache, and     thrombocytopenia are suggestive.  I plan to admit him for IV fluids,     empiric antibiotic treatment, a neurologist consultation particularly for     the question of whether a lumbar puncture is indicated, and an infectious     diseases consultation.  At this time, we will hold on a lumbar puncture     due to his anticoagulation.  2. Dilated cardiomyopathy:  Stable on his current medical regimen.  He     appears to be hypovolemic, so his ARB, Spironolactone, and diuretics will     be held, and he will be given IV fluids for now.  3.     Epigastric pain:  This is of uncertain etiology, and his exam is currently     benign.  He will be continued on a proton pump inhibitor and if his     symptoms recur, a right upper quadrant ultrasound will be obtained and     consideration given to obtaining a gastrointestinal consultation.                                               Barry Dienes Eloise Harman, M.D.    DGP/MEDQ  D:  07/14/2003  T:  07/14/2003  Job:  [id number]  cc:   Peter M. [place], M.D.  [address], [address]  [place], [place] [address]  Fax: [phone]   Ollen Gross, M.D. [address]  [place]  [place] [address]  Fax: [phone]

## 2011-02-05 NOTE — Consult Note (Signed)
NAME:  Darin Vega, Darin Vega                          ACCOUNT NO.:  [id number]   MEDICAL RECORD NO.:  [id number]                   PATIENT TYPE:  INP   LOCATION:  2008                                 FACILITY:  [facility]   PHYSICIAN:  Jefry H. Pollyann Kennedy, M.D.                DATE OF BIRTH:  Jun 26, 1956   DATE OF CONSULTATION:  03/21/2004  DATE OF DISCHARGE:                                   CONSULTATION   DATE OF CONSULTATION:  03/21/04 at 9:00 a.m.   SITE OF CONSULTATION:  [facility].   REFERRING PHYSICIAN:  Dr. Darlina Sicilian.   REASON FOR CONSULTATION:  Hoarseness.   HISTORY:  This is a 55 year old gentleman with a long and complicated  cardiac history that started with idiopathic dilated cardiomyopathy several  years ago.  He has had problems with implantable defibrillators and  infection and recently was admitted with Staphylococcus sepsis.  He  underwent an emergent transesophageal echo about one week ago and following  that has had severe hoarseness and some difficultly swallowing.  He  complains of some aspiration of fluids.  He has some difficulty swallowing  his largest pill.  Remainder of medical history is in the chart.   PHYSICAL EXAMINATION:  GENERAL:  He is a relatively healthy-appearing  gentleman.  His voice is weak and breathy.  NECK:  There are no palpable neck masses.  HEENT:  Oral cavity and pharynx are clear.  He has severe gag reflex.  Tongue and palate mobility are normal and symmetric.  Nasal cavities are  clear with some septal deviation towards the left.  He was not able to  tolerate indirect laryngoscopy.   PROCEDURE:  Topical cocaine solution was sprayed into the nasal cavities.  Fiberoptic laryngoscopy was performed.  There were no intrinsic laryngeal  masses.  There is paralysis of the left vocal cord with the cord in the  paramedian position.  There is reasonably good glottic closure, but a  residual posterior glottic chink present.  There is no  pooling of  secretions.   IMPRESSION:  Acute left vocal cord paralysis.  This could be secondary to  the cardiomegaly.  In some cases with in particular left atrial enlargement  that is known to cause pressure on the lower part of the recurrent laryngeal  nerve.  This could also be idiopathic.  I do not see any reason to perform  any further imaging studies at this time.  His chest x-rays have not  revealed any mediastinal adenopathy or masses.  We had a lengthy discussion  about the nature of this problem.  Dietary recommendations were made  including avoidance of excessive liquids, chin tuck and turning the head to  the side to help with swallowing.  We will recommend speech therapy  consultation to assist with his swallowing if these measures to provide  dramatic improvement.  We will watch him over  the next several days to weeks  to see if he has good compensation.  If he continues to have severe  problems, then medialization laryngoplasty may be necessary, however this  has the complicating factor of an additional foreign body this time in  his larynx.  He has had severe problems with his defibrillators and I am  somewhat hesitant to want to introduce any additional foreign bodies into  him.  Hopefully he will compensate or improve spontaneously.  We will follow  in the next several days or in the next few weeks if he is discharged to  home.                                               Jefry H. Pollyann Kennedy, M.D.    JHR/MEDQ  D:  03/21/2004  T:  03/23/2004  Job:  [id number]

## 2011-02-05 NOTE — Op Note (Signed)
[place]. [facility]  Patient:    BROWN, DUNLAP Visit Number: [id number] MRN: [id number]          Service Type: MED Location: [phone] Attending Physician:  [place], Peter Manning Dictated by:   Nathen May, M.D., [facility] Admit Date:  10/28/2001   CC:         Peter M. [place]and, M.D.  Electrophysiology Laboratory  [facility]   Operative Report  PREOPERATIVE DIAGNOSIS:  Severe nonischemic cardiomyopathy and syncope.  Left bundle branch block.  POSTOPERATIVE DIAGNOSIS:  Severe nonischemic cardiomyopathy and syncope.  Left bundle branch block.  PROCEDURE PERFORMED:  Contrast sonography and defibrillator implantation.  PROCEDURE:  Following obtaining informed consent, the patient was brought to the electrophysiology laboratory and placed on the fluoroscopic table in the supine position.  After routine prep and drape in the left upper chest, intravenous contrast was injected via the left antecubital vein to identify the course and patency of the extrathoracic left subclavian vein.  This having been accomplished, lidocaine was infiltrated in the prepectoral subclavicular region.  An incision was made and carried down to the layer of the preprectoral fascia using electrocautery. A pocket was formed using electrocautery and sharp dissection.  Hemostasis was obtained.  Thereafter, attention was turned to gain access to the extrathoracic left subclavian vein which was accomplished without difficulty without the aspiration of air, puncturing the artery.  A guide wire was placed and retained, and a 0 silk suture was placed in a figure-of-eight fashion and allowed to hang loosely.  Subsequently, a 9-French tear-away introducer sheath was placed through which was passed a Guidant Endotak Reliance, model 0158, active fixation dual coiled defibrillator lead, serial [id number].  Under fluoroscopic guidance, it was manipulated into the right  ventricular apex where there bipolar R-wave was 10.5 mV with a pacing impedance of 900 ohms and a patient threshold of 0.5 msec with 0.6 volts and a current threshold of 0.8 ma.  With parameters recorded, it was secured to the prepectoral fascia and then attached to Guidant Prism 2VRICD, model 1860, serial [id number].  Defibrillation threshold testing was undertaken.  Through the device, the ______ were made, which were not recounted here.  The previous device was also subsequently explanted.  However, ventricular fibrillation was induced using T-wave shock.  After a total duration of 9.5 seconds, a 17-joule shock was delivered through a measured resistance of 41 ohms and terminated after failure to terminate ventricular fibrillation.  At 6 seconds after this shock, a rescue shock of 360 joules was delivered.  The position was re-examined, and it was chosen to leave it where it was. Polarity was reversed, and ventricular fibrillation was reintroduced via T-wave shock.  After a total duration of 12 seconds, a 31-joule shock was delivered through a measured resistance of 39 ohms, again failing to terminate ventricular fibrillation. A 360-joule shock was delivered approximately 1-2 seconds later as a rescue shock, restoring sinus rhythm.  At this point, it was elected to place a subcutaneous Array.  This was done with significant difficulty.  On numerous passes, I was unable to further advance the sheath delivery unit.  Ultimately, three sheath tracks were accomplished fairly laterally with some posterior extension.  The three leads of the Array were placed, and each was secured around a concocted lead sleeve from a cut off lead cap.  The leads were then attached to a Guidant Prism DR high-energy device, model 1853, serial [id number].  Through this device, a  bipolar R wave of 20 mV with a patient impedance of 636 ohms and a pacing threshold of 0.6 volts.  The high voltage impedance was 39  ohms.  Ventricular fibrillation was induced via the T wave shock.  After a total duration of 13 seconds, a 31 joule shock was delivered through a resistance of 47 ohms, terminating ventricular fibrillation and restoring sinus rhythm.  After a wait of 6-7 minutes, ventricular fibrillation was reinduced. A 31-joule shock delivered through a measured resistance of 47 ohms failed to terminate ventricular fibrillation, and a rescue shock of 360 joules was delivered, restoring sinus rhythm.  Two things should be noted.  First, the Array was placed through a second incision that was made in a vertical direction about 2 cm inferior to the lateral aspect of the previous wound.  In addition, the pocket was extended both laterally, as well as inferiorly to accommodate the extra wires. Initially, the vertical second incision was closed.  Following the aforementioned DFT checks, the leads in the pulse generator were then placed in the pocket and secured to the prepectoral fascia, and the wound was closed in three layers in a normal fashion.  It should be noted that the pocket was also copiously irrigated with antibiotic containing saline solution.  It was elected at this point to terminate the procedure. The patient had been under sedation for greater than two hours.  It was elected to bring the patinet back the following day to recheck it at an output of 31 joules with a maximum device energy of 41 joules.  In the event that this failed, the patient would be initiated on Sotalol with repeat DFT testing next week.  The patient tolerated the procedure well. Dictated by:   Nathen May, M.D., [facility] Attending Physician:  [place], Peter Manning DD:  11/02/01 TD:  11/03/01 Job: [id number]

## 2011-02-05 NOTE — Procedures (Signed)
[facility]  Patient:    Darin Vega, Darin Vega                       MRN: [id number] Proc. Date: 12/27/00 Adm. Date:  [id number] Attending:  Orland Mustard CC:         Barry Dienes. Eloise Harman, M.D.   Procedure Report  PROCEDURE:  Colonoscopy.  MEDICATIONS:  Fentanyl 50 mcg, Versed mg IV.  SCOPE:  Olympus adult video colonoscope.  INDICATIONS FOR PROCEDURE:  A 55 year old gentleman who had a recent rectal polyp removed three years ago. This is done to evaluate for additional polyps.  DESCRIPTION OF PROCEDURE:  The procedure had been explained to the patient and consent obtained. With the patient in the left lateral decubitus position, a digital exam was performed and the Olympus adult video colonoscope was inserted and advanced under direct visualization. The prep was excellent. We were able to advance to the cecum using abdominal pressure and position changes. The cecum, ascending colon, hepatic flexure, transverse colon, splenic flexure, descending and sigmoid colon were seen well upon removal. No polyps were seen throughout the entire colon and there was no significant diverticular disease. The scope was withdrawn down into the rectum. The rectum was free of polyps. The patient tolerated the procedure well and was maintained on low flow oxygen and pulse oximeter throughout the procedure.  ASSESSMENT:  No evidence for any further colon polyps.  PLAN:  Will recommend repeating procedure in five years. DD:  12/27/00 TD:  12/27/00 Job: [id number]

## 2011-02-05 NOTE — Consult Note (Signed)
NAME:  Darin Vega, HOLEMAN                          ACCOUNT NO.:  [id number]   MEDICAL RECORD NO.:  [id number]                   PATIENT TYPE:  INP   LOCATION:  4743                                 FACILITY:  [facility]   PHYSICIAN:  Petra Kuba, M.D.                 DATE OF BIRTH:  1956/09/08   DATE OF CONSULTATION:  04/14/2004  DATE OF DISCHARGE:                                   CONSULTATION   CONSULTING PHYSICIAN:  Petra Kuba, M.D.   REFERRING PHYSICIAN:  Peter M. [place], M.D.   REASON FOR CONSULTATION:  The patient is seen at the request of Dr. Dossie Arbour for nausea.  He has a long and extensive medical history which is  very difficult to completely follow, but basically he had been in his normal  state of health until the last month when he presented with his second  defibrillator infection.  He had it removed and has been on antibiotics for  six weeks.  He has had a swallowing problem and had seen Dr. Pollyann Kennedy during  the last hospitalization, but supposedly again had been doing well.  He was  tolerating things like tacos and hamburgers until a week ago Sunday when he  began having increased nausea and some episodic vomiting.  He has not hurt  at all.  He has had really no lower bowel complaints.  Other than a  colonoscopy by Dr. Edwards in 2002, he has not had any recent workup.  He  has on admission increased BUN and creatinine and elevated liver tests, but  I do not have the ones from his recent hospitalization.  The only old chart  on the floor is from 2004.  He really has had no appetite for the last ten  days.  Prior to that he had been hungry.  He has had no other sick contacts  or medication changes to explain easily the change.   PAST MEDICAL HISTORY:  1. Dilated non-ischemic cardiomyopathy, question of ejection fraction of     15%.  He has been seen in the past by [facility] for consideration of     transplant, but needs to re-see them at some point in the future.  2. Left  bundle branch block.  3. History of syncope.  4. Defibrillator.  5. Left vocal cord paralysis.  6. Dysphagia.  7. Migraines.  8. Colon polyps in January of 1999 with a non-diagnostic colonoscopy in     20 02.  9. Allergies.  10.      Reflux.  11.      History of ulcers in 1987.  12.      History of kidney stones.   MEDICATIONS:  1. Esgic.  2. Claritin.  3. Pamelor.  4. Diovan.  5. Coreg.  6. Coumadin.  7. Bactroban.  8. Ceftriaxone.  9. Possibly Lexapro although I do not think he got that  medication.   ALLERGIES:  1. ACE INHIBITORS.  2. PENICILLIN.  3. PEANUTS.   FAMILY HISTORY:  Negative for any obvious GI problems.   SOCIAL HISTORY:  He does not smoke or drink. He minimizes other over the  counter medication use.   REVIEW OF SYMPTOMS:  Negative except for above.   PHYSICAL EXAMINATION:  VITAL SIGNS: Stable.  His blood pressure has been on  the low side and he is afebrile.  LUNGS  Clear.  CARDIOVASCULAR:  Regular rate and rhythm.  ABDOMEN:  Soft and non-tender with no obvious percussion splash.  EXTREMITIES:  Adequate peripheral pulses. No pedal edema.   LABORATORY DATA:  BUN is 54, creatinine is 2.0, albumin 3.2.  SGOT 108, SGPT  115, lipase is slightly elevated at 74, amylase is normal.  PT is 20.9.  White count is normal, hemoglobin 13.6 and platelet count is 233,000 with a  normal differential.   Kidney ultrasound was okay.   ASSESSMENT:  1. Multiple medical problems.  2. Nausea with episodic vomiting, questionable etiology.  3. Dysphagia.  4. Increased liver function tests, BUN and creatinine.   PLAN:  1. I first need to get his old laboratories from his last hospitalization     and see if there are changes.  2. We will also need to make sure his abdomen ultrasound was done and not     just of the kidneys.  3. He would probably be a better candidate for an MRI, possibly an MRA, to     better evaluate his abdomen.  I do not think a CT would be helpful  at     this time secondary to probably being unable to take in contrast and not     use IV contrast for the kidneys.  Either myself or Dr. Randa Evens will     follow.  I do not think this represents intestinal ischemia since he does     not hurt and his symptoms are not brought on by eating. He seems to have     them chronically.  We will follow with you.  Thank you very much.                                               Petra Kuba, M.D.    MEM/MEDQ  D:  04/14/2004  T:  04/14/2004  Job:  [id number]   cc:   Peter M. [place], M.D.  [address]  [place], [place] [address]  Fax: [phone]   Barry Dienes. Eloise Harman, M.D.  [address]  [place]  [place] [address]  Fax: [phone]   [facility] Salvia, M.D.   James L. Malon Kindle., M.D.  [address]  [place]  [place] [address]  Fax: [phone]   Jeannett Senior. Pollyann Kennedy, M.D.  [address] [place]  [place] [address]  Fax: [phone]

## 2011-02-05 NOTE — Discharge Summary (Signed)
NAME:  Darin Vega, Darin Vega NO.:  [id number]   MEDICAL RECORD NO.:  [id number]                   PATIENT TYPE:  INP   LOCATION:  2008                                 FACILITY:  [facility]   PHYSICIAN:  Peter M. [place], M.D.               DATE OF BIRTH:  1956-06-04   DATE OF ADMISSION:  03/12/2004  DATE OF DISCHARGE:  03/25/2004                                 DISCHARGE SUMMARY   HISTORY OF PRESENT ILLNESS:  Darin Vega is a 55 year old white male with a  history of idiopathic dilated cardiomyopathy, non-ischemic, with an ejection  fraction of 15%.  He has a history of syncope in February 2003, and  underwent an AICD implant at that time.  He has been on chronic  anticoagulation.  The patient was admitted in October 2004, with  methicillin-sensitive Staphylococcus bacteremia and did have a documented  vegetation of his defibrillator wire at that time requiring explantation of  his defibrillator device.  He subsequently had re-implantation of the  device.  He has done well since then until recently developed bifrontal  headaches, malaise, sweats.  On the day of admission he developed a high  fever and presented to the emergency room.  Initial head CT scan was  unremarkable.  He is now admitted for further evaluation.   For details of his past medical history, social history, family history, and  physical examination, please see admission history and physical.   LABORATORY DATA:  His ECG showed normal sinus rhythm, left atrial  enlargement, left bundle branch block.  His pH was 7.45, PCO2 of 40, PO2 of  46, bicarbonate of 27.  White count was 14,000, hemoglobin 14.5, hematocrit  42.4, platelets 193,000.  PT was 25, INR of 3.3, PTT was 36.  Sodium 135,  potassium 3.6, chloride 100, CO2 27, BUN 25, creatinine 1.6, glucose of 110.  LFT's were initially normal.  Total bilirubin was 1.6.  BNP level was 476.  CPK-MB's were negative x3.  Troponin's were slightly elevated at  0.05, 0.06,  and 0.06.  Urinalysis was normal.  Chest x-ray showed marked cardiomegaly,  but no active disease.   HOSPITAL COURSE:  On his initial presentation, the patient was hypotensive  and hypoxic.  He was started on IV dopamine and titrated to maintain a blood  pressure greater than 85 systolic.  He was given IV fluids with normal  saline.  His normal cardiac medications were held due to his profound  hypotension and shock.  He was administered IV antibiotics with vancomycin  and Cefepime IV.  Blood cultures were obtained and subsequently grew out  methicillin-sensitive Staphylococcus aureus bacteria.  His urine cultures  were negative.  The patient had an echocardiogram which showed severe left  ventricular dysfunction with marked left ventricular enlargement, and an  ejection fraction of approximately 15%.  There was no evidence of  vegetations on transthoracic echocardiogram.  He subsequently had a  transesophageal echocardiogram which again showed no evidence of  vegetations.  The patient did defervesce and his white count declined to  normal.  His shocky picture also improved over the next several days, and he  was able to be weaned off of dopamine.  However, as a result of his profound  hypotension he did have elevation of his liver function studies with a peak  AST of 174, ALT of 161.  These subsequently declined to 106 and 50,  respectively.  His creatinine also increased to 2, by the time of discharge  had returned to baseline at 1.3.  His potassium declined to 3.2, and this  was repleted.  His white blood cell count at the time of discharge was 6600.  The patient was seen in consultation by Dr. Hurman Horn, Dr. Lewayne Bunting  from EP service.  He was also seen in consultation with infectious disease.  Given his recurrent Staph bacteremia in the setting of a prior defibrillator  implant, it was felt that this device needed to be explanted.  This was  performed on March 18, 2004, by Dr. Ladona Ridgel.  In the interim, the patient was  fitted with an external defibrillator device provided by [employer].  In  further consultation with Dr. Graciela Husbands and Dr. Maurice March, it was felt that the  patient should have six weeks of IV antibiotics with followup surveillance  blood cultures afterwards.  It was also recommended that he have intranasal  Bactroban for two weeks.  If at this point he remains stable we could  consider implantation of another defibrillator device.  The final decision  on this has not been made at the time of discharge.  The patient continued  to improve.  He was able to be resumed on his usual cardiac medications,  remained hemodynamically stable.  He remained afebrile.  He was discharged  home in stable condition on March 25, 2004, with plans to continue IV  antibiotics with ceftriaxone for another four weeks of therapy.   DISCHARGE DIAGNOSES:  1. 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  2. Left bundle branch block.  3. Non-ischemic dilated cardiomyopathy.  4. Syncope.  5. Status post ICD implants and explants x2.  6. Left vocal cord paralysis.  During hospitalization, the patient was noted     to have persistent hoarseness.  He was seen by Dr. Pollyann Kennedy who diagnosed     him with a left vocal cord paralysis.  This is presumably due to     pharyngeal nerve compression possibly due to his marked cardiomegaly.  It     was not felt to be related to trauma from his transesophageal     echocardiogram.  He did have a swallowing evaluation.  They recommended a     mechanical soft diet.  The patient did have a PICC line placed prior to     discharge.   DISCHARGE MEDICATIONS:  1. Aciphex 20 mg daily.  2. Claritin 10 mg daily p.r.n.  3. Pamelor 50 mg q.h.s.  4. Lexapro 10 mg q.h.s.  5. Lasix 40 mg daily.  6. Diovan 40 mg daily.  7. Coumadin 2.5 mg Monday, Wednesday, Friday, and Sunday, and 5 mg Tuesday,     Thursday, and Saturday. 8.  Aldactone 25 mg daily.  9. Potassium 20 mEq daily.  10.      Coreg 12.5 mg b.i.d.  11.      Bactroban 2% cream to be applied in small  amounts in each nares     b.i.d. x2 weeks.  12.      Ceftriaxone 1 g IV daily.   ACTIVITY:  The patient will participate only in light activity.   DIET:  Will continue a low salt, mechanical soft diet.   FOLLOWUP:  1. We will have home health follow up for his IV antibiotics and also have     speech therapy re-evaluate at home.  2. Follow up with Dr. Swaziland in three weeks.                                                Peter M. [place]iland, M.D.    PMJ/MEDQ  D:  03/25/2004  T:  03/25/2004  Job:  [id number]   cc:   Barry Dienes. Eloise Harman, M.D.  [address]  [place]  [place] [address]  Fax: [phone]   Fransisco Hertz, M.D.  [address] [place]  [place] [address]  Fax: [phone]   [facility] Salvia, M.D.

## 2011-02-05 NOTE — Procedures (Signed)
[place]. [facility]  Patient:    Darin Vega, Darin Vega Visit Number: [id number] MRN: [id number]          Service Type: MED Location: [phone] Attending Physician:  [place], Peter Manning Dictated by:   Doylene Canning. Ladona Ridgel, M.D. [facility] Proc. Date: 11/03/01 Admit Date:  10/28/2001   CC:         Nathen May, M.D., [facility]             Peter M. [place], M.D.             Barry Dienes Eloise Harman, M.D.             Kathrine Cords                           Procedure Report  PROCEDURE:  Noninvasive defibrillation threshold testing.  INDICATION:  High DFT at initial implant.  INTRODUCTION:  The patient is a very pleasant 55 year old male with syncope and a nonischemic cardiomyopathy with an ejection fraction of 15%.  He has nonsustained VT.  He underwent ICD implantation on November 02, 2001, and his procedure was complicated by very elevated DFT.  He subsequently had a subcutaneous array placed and at final testing, one 13-joule shock restored sinus rhythm and a second shock did not, requiring external shocking.  He returns today for a third DFT test at 31 joules (the can is 41 joules).  DESCRIPTION OF PROCEDURE:  After informed consent was obtained, the patient was taken to the diagnostic EP lab in a fasted state.  After the usual preparation, he was sedated with intravenous fentanyl and midazolam.  The patients indwelling defibrillator was a Prizm model 1853 dual-chamber high-voltage can with the atrial port capped.  The device serial number was [id number].  The first and only DFT test was carried out with a T-wave shock. Thirty-one joules subsequently restored sinus rhythm.  The charge time was 13 seconds.  After this, the patient was allowed to wake up and returned to his room in satisfactory condition.  COMPLICATIONS:  None.  RESULTS:  This demonstrates successful defibrillation threshold testing at 31 joules in a patient with a high-energy (41-joule) implantable  cardioverter- defibrillator generator. Dictated by:   Doylene Canning. Ladona Ridgel, M.D. [facility] Attending Physician:  [place], Peter Manning DD:  11/03/01 TD:  11/03/01 Job: [id number]

## 2011-06-29 LAB — COMPREHENSIVE METABOLIC PANEL
Alkaline Phosphatase: 46
BUN: 43 — ABNORMAL HIGH
CO2: 27
Chloride: 106
GFR calc non Af Amer: 38 — ABNORMAL LOW
Glucose, Bld: 101 — ABNORMAL HIGH
Potassium: 4.5
Total Bilirubin: 1

## 2011-06-29 LAB — CBC
HCT: 33 — ABNORMAL LOW
Hemoglobin: 11.3 — ABNORMAL LOW
RBC: 3.63 — ABNORMAL LOW
RDW: 13.1
WBC: 4.7

## 2011-06-29 LAB — DIFFERENTIAL
Basophils Absolute: 0
Basophils Relative: 1
Neutro Abs: 3.2
Neutrophils Relative %: 69

## 2011-06-29 LAB — APTT: aPTT: 29

## 2011-06-29 LAB — PROTIME-INR: INR: 1

## 2011-07-07 LAB — I-STAT 8, (EC8 V) (CONVERTED LAB)
Acid-Base Excess: 1
Bicarbonate: 26.2 — ABNORMAL HIGH
Chloride: 108
pCO2, Ven: 43.8 — ABNORMAL LOW
pH, Ven: 7.385 — ABNORMAL HIGH

## 2011-09-07 DIAGNOSIS — K635 Polyp of colon: Secondary | ICD-10-CM

## 2011-09-07 HISTORY — DX: Polyp of colon: K63.5

## 2011-09-10 ENCOUNTER — Ambulatory Visit (INDEPENDENT_AMBULATORY_CARE_PROVIDER_SITE_OTHER): Payer: 59

## 2011-09-10 DIAGNOSIS — J019 Acute sinusitis, unspecified: Secondary | ICD-10-CM

## 2012-04-03 DIAGNOSIS — Z7960 Long term (current) use of unspecified immunomodulators and immunosuppressants: Secondary | ICD-10-CM

## 2012-04-03 DIAGNOSIS — Z79899 Other long term (current) drug therapy: Secondary | ICD-10-CM

## 2012-04-03 DIAGNOSIS — D849 Immunodeficiency, unspecified: Secondary | ICD-10-CM

## 2012-04-03 HISTORY — DX: Other long term (current) drug therapy: Z79.899

## 2012-04-03 HISTORY — DX: Immunodeficiency, unspecified: D84.9

## 2012-04-03 HISTORY — DX: Long term (current) use of unspecified immunomodulators and immunosuppressants: Z79.60

## 2013-03-20 HISTORY — PX: SKIN BIOPSY: SHX1

## 2013-04-17 DIAGNOSIS — N179 Acute kidney failure, unspecified: Secondary | ICD-10-CM

## 2013-04-17 HISTORY — DX: Acute kidney failure, unspecified: N17.9

## 2013-05-04 DIAGNOSIS — G629 Polyneuropathy, unspecified: Secondary | ICD-10-CM

## 2013-05-04 HISTORY — DX: Polyneuropathy, unspecified: G62.9

## 2013-08-22 DIAGNOSIS — B079 Viral wart, unspecified: Secondary | ICD-10-CM

## 2013-08-22 HISTORY — DX: Viral wart, unspecified: B07.9

## 2013-09-25 ENCOUNTER — Encounter (HOSPITAL_COMMUNITY): Payer: Self-pay | Admitting: Emergency Medicine

## 2013-09-25 ENCOUNTER — Observation Stay (HOSPITAL_COMMUNITY)
Admission: EM | Admit: 2013-09-25 | Discharge: 2013-09-26 | Disposition: A | Discharge status: Home / Self Care | Payer: 59 | Attending: Endocrinology | Admitting: Endocrinology

## 2013-09-25 DIAGNOSIS — Z8669 Personal history of other diseases of the nervous system and sense organs: Secondary | ICD-10-CM | POA: Insufficient documentation

## 2013-09-25 DIAGNOSIS — R55 Syncope and collapse: Principal | ICD-10-CM | POA: Diagnosis present

## 2013-09-25 DIAGNOSIS — G238 Other specified degenerative diseases of basal ganglia: Secondary | ICD-10-CM | POA: Diagnosis present

## 2013-09-25 DIAGNOSIS — R630 Anorexia: Secondary | ICD-10-CM

## 2013-09-25 DIAGNOSIS — Z941 Heart transplant status: Secondary | ICD-10-CM | POA: Insufficient documentation

## 2013-09-25 DIAGNOSIS — R197 Diarrhea, unspecified: Secondary | ICD-10-CM

## 2013-09-25 DIAGNOSIS — N183 Chronic kidney disease, stage 3 unspecified: Secondary | ICD-10-CM | POA: Diagnosis present

## 2013-09-25 DIAGNOSIS — I129 Hypertensive chronic kidney disease with stage 1 through stage 4 chronic kidney disease, or unspecified chronic kidney disease: Secondary | ICD-10-CM | POA: Insufficient documentation

## 2013-09-25 HISTORY — DX: Personal history of other diseases of the digestive system: Z87.19

## 2013-09-25 HISTORY — DX: Syncope and collapse: R55

## 2013-09-25 HISTORY — DX: Personal history of peptic ulcer disease: Z87.11

## 2013-09-25 HISTORY — DX: Headache: R51

## 2013-09-25 HISTORY — DX: Heart failure, unspecified: I50.9

## 2013-09-25 HISTORY — DX: Migraine, unspecified, not intractable, without status migrainosus: G43.909

## 2013-09-25 HISTORY — DX: Heart transplant status: Z94.1

## 2013-09-25 HISTORY — DX: Other specified degenerative diseases of basal ganglia: G23.8

## 2013-09-25 HISTORY — DX: Essential (primary) hypertension: I10

## 2013-09-25 LAB — CBC WITH DIFFERENTIAL/PLATELET
BASOS ABS: 0 10*3/uL (ref 0.0–0.1)
BASOS PCT: 0 % (ref 0–1)
EOS ABS: 0.1 10*3/uL (ref 0.0–0.7)
Eosinophils Relative: 2 % (ref 0–5)
HCT: 35.2 % — ABNORMAL LOW (ref 39.0–52.0)
Hemoglobin: 11.6 g/dL — ABNORMAL LOW (ref 13.0–17.0)
Lymphocytes Relative: 18 % (ref 12–46)
Lymphs Abs: 0.7 10*3/uL (ref 0.7–4.0)
MCH: 30 pg (ref 26.0–34.0)
MCHC: 33 g/dL (ref 30.0–36.0)
MCV: 91 fL (ref 78.0–100.0)
Monocytes Absolute: 0.5 10*3/uL (ref 0.1–1.0)
Monocytes Relative: 13 % — ABNORMAL HIGH (ref 3–12)
NEUTROS ABS: 2.7 10*3/uL (ref 1.7–7.7)
NEUTROS PCT: 68 % (ref 43–77)
PLATELETS: 123 10*3/uL — AB (ref 150–400)
RBC: 3.87 MIL/uL — ABNORMAL LOW (ref 4.22–5.81)
RDW: 12.7 % (ref 11.5–15.5)
WBC: 4 10*3/uL (ref 4.0–10.5)

## 2013-09-25 LAB — TROPONIN I: Troponin I: <0.3 ng/mL (ref <0.30)

## 2013-09-25 LAB — BASIC METABOLIC PANEL
BUN: 39 mg/dL — ABNORMAL HIGH (ref 6–23)
CALCIUM: 9.4 mg/dL (ref 8.4–10.5)
CHLORIDE: 100 meq/L (ref 96–112)
CO2: 29 mEq/L (ref 19–32)
CREATININE: 2.51 mg/dL — AB (ref 0.50–1.35)
GFR, EST AFRICAN AMERICAN: 31 mL/min — AB (ref >90)
GFR, EST NON AFRICAN AMERICAN: 26 mL/min — AB (ref >90)
Glucose, Bld: 106 mg/dL — ABNORMAL HIGH (ref 70–99)
Potassium: 5 mEq/L (ref 3.7–5.3)
Sodium: 141 mEq/L (ref 137–147)

## 2013-09-25 LAB — URINALYSIS, ROUTINE W REFLEX MICROSCOPIC
BILIRUBIN URINE: NEGATIVE
Glucose, UA: NEGATIVE mg/dL
Hgb urine dipstick: NEGATIVE
KETONES UR: NEGATIVE mg/dL
LEUKOCYTES UA: NEGATIVE
NITRITE: NEGATIVE
PH: 7 (ref 5.0–8.0)
PROTEIN: NEGATIVE mg/dL
Specific Gravity, Urine: 1.013 (ref 1.005–1.030)
UROBILINOGEN UA: 0.2 mg/dL (ref 0.0–1.0)

## 2013-09-25 MED ORDER — SODIUM CHLORIDE 0.9 % IV SOLN
INTRAVENOUS | Status: DC
  Administered 2013-09-25: 125 mL/h via INTRAVENOUS

## 2013-09-25 MED ORDER — ENOXAPARIN SODIUM 40 MG/0.4ML ~~LOC~~ SOLN
40.0000 mg | SUBCUTANEOUS | Status: DC
Start: 2013-09-25 — End: 2013-09-26
  Administered 2013-09-25: 40 mg via SUBCUTANEOUS
  Filled 2013-09-25 (×2): qty 0.4

## 2013-09-25 MED ORDER — SIMVASTATIN 20 MG PO TABS
20.0000 mg | ORAL_TABLET | Freq: Every day | ORAL | Status: DC
  Administered 2013-09-25: 20 mg via ORAL
  Filled 2013-09-25 (×2): qty 1

## 2013-09-25 MED ORDER — LEVOFLOXACIN 500 MG PO TABS
500.0000 mg | ORAL_TABLET | Freq: Every day | ORAL | Status: DC
  Administered 2013-09-25 – 2013-09-26 (×2): 500 mg via ORAL
  Filled 2013-09-25 (×2): qty 1

## 2013-09-25 MED ORDER — NON FORMULARY: 40.0000 mg | Freq: Every day | Status: DC

## 2013-09-25 MED ORDER — PANTOPRAZOLE SODIUM 40 MG PO TBEC
40.0000 mg | DELAYED_RELEASE_TABLET | Freq: Every day | ORAL | Status: DC
  Administered 2013-09-26: 40 mg via ORAL
  Filled 2013-09-25: qty 1

## 2013-09-25 MED ORDER — TACROLIMUS 1 MG PO CAPS
1.0000 mg | ORAL_CAPSULE | Freq: Two times a day (BID) | ORAL | Status: DC
Start: 2013-09-25 — End: 2013-09-26
  Administered 2013-09-25: 1 mg via ORAL
  Filled 2013-09-25 (×3): qty 2

## 2013-09-25 MED ORDER — CARVEDILOL 12.5 MG PO TABS
12.5000 mg | ORAL_TABLET | Freq: Two times a day (BID) | ORAL | Status: DC
  Filled 2013-09-25 (×2): qty 1

## 2013-09-25 MED ORDER — ADULT MULTIVITAMIN W/MINERALS CH
1.0000 | ORAL_TABLET | Freq: Every day | ORAL | Status: DC
  Administered 2013-09-25 – 2013-09-26 (×2): 1 via ORAL
  Filled 2013-09-25 (×2): qty 1

## 2013-09-25 MED ORDER — HYDROCODONE-ACETAMINOPHEN 5-325 MG PO TABS: 1.0000 | ORAL_TABLET | Freq: Three times a day (TID) | ORAL | Status: DC | PRN

## 2013-09-25 MED ORDER — VITAMIN C 250 MG PO TABS
250.0000 mg | ORAL_TABLET | Freq: Every day | ORAL | Status: DC
Start: 2013-09-25 — End: 2013-09-26
  Administered 2013-09-25 – 2013-09-26 (×2): 250 mg via ORAL
  Filled 2013-09-25 (×2): qty 1

## 2013-09-25 MED ORDER — SODIUM CHLORIDE 0.9 % IV BOLUS (SEPSIS)
500.0000 mL | Freq: Once | INTRAVENOUS | Status: AC
  Administered 2013-09-25: 500 mL via INTRAVENOUS

## 2013-09-25 NOTE — ED Notes (Signed)
Per EMS and Per Pt.  He has not felt well overthe past week. Pt. Had a yncopal episode around Christmas but did notome to the ER.  Pt. Was in the shower and when he got out to get his clothes on he passed out. Pt.'s wife found him and he was out for about one minute. Pt. Was oted face down at this time. Pt. Is noted also with an abrasion to the left upper shoulder.

## 2013-09-25 NOTE — ED Notes (Signed)
Attempted report but was unsuccessful. Will try again later.

## 2013-09-25 NOTE — ED Notes (Signed)
Pt's wife given a cup of coffee

## 2013-09-25 NOTE — H&P (Signed)
PCP:   Donnajean Lopes, MD   Chief Complaint:  Syncope  HPI: This is a 58 year old white male with a history of heart transplant, prior syncope, and  FAHR syndrome. He was ill over the [place] Year's holiday with upper respiratory infection. He still has a bit of a congested cough. He was treated with Levaquin. His by mouth intake has been fair. He said no GI symptoms.  he got up and took a shower this morning. He dried himself off and was getting dressed when his wife heard his blood. He was found face down and was still breathing but lying still and not responding. She assessed him briefly and then called 911. 1-2 minutes elapsed and she came back to his side and he was still not responding. When EMS arrived very short while later he was pale but now conscious. He had no prodrome prior this. He had no neurologic weakness in any new way up on the first of analysis. He had no hypotension. He was sweaty and pale. He feels back to his normal baseline at the moment. He not eaten anything at this moment. He not taken his medications yet. He had no nausea. He had a another syncopal episode at Thanksgiving where he was heading to the bathroom and had fecal incontinence and briefly lost consciousness. He was evaluated at that time. His gait is chronically unstable and not different. He's lost a couple pounds with this illness. He's not been exposed to any other unusual illnesses. He's had no cardiac symptoms. He notes no chest pain or shortness of breath. His gait is unsteady was had no falls and no head injury. His left shoulder is a bit sore at the present. He has no memory of either these episodes and remembers no prodrome.  Review of Systems:  Review of Systems - see above Past Medical History: Past Medical History  Diagnosis Date  . Heart transplanted   . 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 BX GRADE 0 REJECTION MRSA WITH ICD REMOVAL EF 55% IN 2013  Physicians involved in care:  *Klein/Jordan (Cards)   *Aluisio (Ortho), Charlynn Grimes ([facility]),   (Jakeya Gherardi [place] [facility])   Family History (reviewed - no changes required): Father (D) 51 Mother (D) 43, Depression & CAD Siblings, 1 brother (Nephrolithiasis) Children, 1 daughter Positive family history of Early CAD   Past Surgical History  Procedure Laterality Date  . Heart transplant      Surgical History (reviewed - no changes required): 12/99 Cardiac Cath 2/03 AICD placement 2005 Heart Transplantation at [facility] 11/05 Vocal Cord surgery for hoarseness Medications: Prior to Admission medications   Medication Sig Start Date End Date Taking? Authorizing Provider  Ascorbic Acid (VITAMIN C PO) Take 1 tablet by mouth daily.   Yes Historical Provider, MD  carvedilol (COREG) 12.5 MG tablet Take 12.5 mg by mouth 2 (two) times daily. 09/18/13  Yes Historical Provider, MD  Cholecalciferol (VITAMIN D PO) Take 1 tablet by mouth daily.   Yes Historical Provider, MD  HYDROcodone-acetaminophen (NORCO/VICODIN) 5-325 MG per tablet Take 1 tablet by mouth 3 (three) times daily as needed. For pain 07/27/13  Yes Historical Provider, MD  levofloxacin (LEVAQUIN) 500 MG tablet Take 1 tablet by mouth daily. For 7 days, starting 09/19/2013 09/19/13  Yes Historical Provider, MD  Multiple Vitamin (MULTIVITAMIN WITH MINERALS) TABS tablet Take 1 tablet by mouth daily.   Yes Historical Provider, MD  pantoprazole (PROTONIX) 40 MG tablet Take 40 mg by mouth daily with breakfast. 08/01/13  Yes Historical Provider, MD   pravastatin (PRAVACHOL) 80 MG tablet Take 40 mg by mouth daily. 08/01/13  Yes Historical Provider, MD  tacrolimus (PROGRAF) 1 MG capsule Take 1-2 mg by mouth 2 (two) times daily. Take 2 capsules in AM and 1 capsule in PM 09/04/13  Yes Historical Provider, MD    Allergies:   Allergies  Allergen Reactions  . Peanuts [Peanut Oil] Anaphylaxis    Social History:  reports that he has never smoked. He has never used smokeless tobacco. He reports that he does not drink alcohol or use illicit drugs.Social History (reviewed - no changes required): Married, 1 daughter, was a Warden/ranger (disabled), [place], [place]  Family History Summary:     Reviewed history and no changes required: 08/07/2013 Mother Ailene Ravel.) - Has Family History of Diabetes - Entered On: 07/18/2013  General Comments - FH: Father (D) 36 Mother (D) 16, Depression & CAD Siblings, 1 brother (Nephrolithiasis) Children, 1 daughter Positive family history of Early CAD  Social History:    Reviewed history from 02/04/2010 and no changes required:       Married, 1 daughter, was a Warden/ranger (disabled), [place], [place]     Physical Exam: Filed Vitals:   09/25/13 1154 09/25/13 1359 09/25/13 1530 09/25/13 1616  BP: 149/95 140/98 153/101 147/127  Pulse:      Temp: 98.9 F (37.2 C) 98.7 F (37.1 C) 97.9 F (36.6 C)   TempSrc: Oral Oral Oral   Resp: _0 SpO2: 98% 98% 99% 99%   General appearance: Thin white male in no distress. Mild nystagmus is present bilaterally. He is not dizzy with no nystagmus. Pupils are large but equal and reactive bilaterally. Discs are sharp. Oral mucous membranes are moist. Palate elevates midline. Tongue protrudes midline. Neck is supple without bruits. No dental trauma is present.  Neck: no adenopathy, no carotid bruit, no JVD and thyroid not enlarged, symmetric, no tenderness/mass/nodules Resp: Clear with occasional rhonchi. No wheezing is present no excess her muscles are in use. Cardio:  Regular and distant. GI: soft, non-tender; bowel sounds normal; no masses,  no organomegaly Extremities: extremities normal, atraumatic, no cyanosis or edema Pulses: Good distal pulses with no edema. There is a mild limitation of abduction of the left shoulder. Lymph nodes: Cervical adenopathy: no cervical lymphadenopathy Neurologic: He is awake alert with fairly clear speech with mild dysarthria. Grip is equal bilaterally. Slight resting tremor is present. Grip is equal bilaterally. Dorsiflexion is equal bilaterally. Gait was not tested. Muscle tone is fair. There is some hyperreflexia of his lower extremities. Mentation appears intact. Skin is warm and dry with well-healed scars and no vitiligo    Labs on Admission:   Recent Labs  09/25/13 0951  NA 141  K 5.0  CL 100  CO2 29  GLUCOSE 106*  BUN 39*  CREATININE 2.51*  CALCIUM 9.4      Recent Labs  09/25/13 1212  WBC 4.0  NEUTROABS 2.7  HGB 11.6*  HCT 35.2*  MCV 91.0  PLT 123*   04/2012:  NEL (1000)   GLUCOSE  108 mg/dl                   60-110   BUN                  [H]  33 mg/dl                    5-23   CREATININE           [H]  2.3 mg/dl                   0.3-1.5  eGFR Non-African American                             29.2  eGFR African American                             35.4   SODIUM                    141 mEq/L                   135-148   POTASSIUM                 4.9 mEq/L                   3.5-5.3   CHLORIDE                  110 mEq/L                   80-111   CO2                       29 mEq/L                    15-35   CALCIUM                   9.6 mg/dL                   7.0-10.5    Radiological Exams on Admission: No results found. Orders placed during the hospital encounter of 09/25/13  . ED EKG: Age not entered, assumed to be 58 years old for purpose of ECG interpretation Sinus rhythm  .    .   .     Assessment/Plan Principal Problem:   Syncope: This is a recurrent  problem. This sounds like it may have been a neurologic rhythm the cardiac event but he does have as strong cardiac history. Extensive evaluation was done roughly one year ago at [facility] without any significant cardiac blockages or diminishment of ejection fraction noted. He has this unusual neurologic syndrome that I know very little about. We will get neurology to see him to see if this might be related to the Parkinson's-like condition that he does have. At the present he seems neurologically intact. We will watch on the monitor. We will gently hydrate him. Will cycle his cardiac enzymes. We'll watch for any arrhythmias. He may need to have more evaluation at [facility]. Electrolytes are fine. Possible mild volume deficits are present. EKG is nonischemic. Blood pressures a bit on the high side if anything. Repeat labs were done in the morning. Active Problems:   CKD (chronic kidney disease) stage 3, GFR 30-59 ml/min: Close to  his baseline.   History of heart transplant: Had a full evaluation about a year ago. See the data above.   Fahr's disease: Unclear what role this plays in his current problems. CODE STATUS is full   Roderick Calo ALAN 09/25/2013, 5:21 PM

## 2013-09-25 NOTE — ED Provider Notes (Signed)
CSN: [id number]     Arrival date & time 09/25/13  [id number] History   First MD Initiated Contact with Patient 09/25/13 [phone]     Chief Complaint  Patient presents with  . Loss of Consciousness   (Consider location/radiation/quality/duration/timing/severity/associated sxs/prior Treatment) HPI Comments: Darin Vega is a 58 y.o. male who presents for evaluation of syncopal episode. He was leaving the shower, walking, in his bathroom, he fell. He is unsure if he lost consciousness. His wife found him on the floor, she had been in the next room, and heard him fall. She did not tell him that he lost consciousness. He did not have any preceding symptoms. The only symptom after the fall was feeling warm. He had another episode of fall with possible syncope, 2 weeks ago. This occurred after he stood up from sitting on the commode. He recently was treated with Levaquin for bronchitis. No recent fever or chills, nausea, vomiting, cough, or shortness of breath. Is using his medications as directed. There are no other known modifying factors.   Patient is a 58 y.o. male presenting with syncope. The history is provided by the patient.  Loss of Consciousness   Past Medical History  Diagnosis Date  . Heart transplanted   . Hypertension    Past Surgical History  Procedure Laterality Date  . Heart transplant     History reviewed. No pertinent family history. History  Substance Use Topics  . Smoking status: Never Smoker   . Smokeless tobacco: Never Used  . Alcohol Use: No    Review of Systems  Cardiovascular: Positive for syncope.  All other systems reviewed and are negative.    Allergies  Peanuts  Home Medications   Current Outpatient Rx  Name  Route  Sig  Dispense  Refill  . Ascorbic Acid (VITAMIN C PO)   Oral   Take 1 tablet by mouth daily.         . carvedilol (COREG) 12.5 MG tablet   Oral   Take 12.5 mg by mouth 2 (two) times daily.         . Cholecalciferol (VITAMIN D PO)  Oral   Take 1 tablet by mouth daily.         Marland Kitchen HYDROcodone-acetaminophen (NORCO/VICODIN) 5-325 MG per tablet   Oral   Take 1 tablet by mouth 3 (three) times daily as needed. For pain         . levofloxacin (LEVAQUIN) 500 MG tablet   Oral   Take 1 tablet by mouth daily. For 7 days, starting 09/19/2013         . Multiple Vitamin (MULTIVITAMIN WITH MINERALS) TABS tablet   Oral   Take 1 tablet by mouth daily.         . pantoprazole (PROTONIX) 40 MG tablet   Oral   Take 40 mg by mouth daily with breakfast.         . pravastatin (PRAVACHOL) 80 MG tablet   Oral   Take 40 mg by mouth daily.         . tacrolimus (PROGRAF) 1 MG capsule   Oral   Take 1-2 mg by mouth 2 (two) times daily. Take 2 capsules in AM and 1 capsule in PM          BP 147/127  Pulse 95  Temp(Src) 97.9 F (36.6 C) (Oral)  Resp 18  SpO2 99% Physical Exam  Nursing note and vitals reviewed. Constitutional: He is oriented to person, place, and time.  He appears well-developed and well-nourished.  HENT:  Head: Normocephalic and atraumatic.  Right Ear: External ear normal.  Left Ear: External ear normal.  Eyes: Conjunctivae and EOM are normal. Pupils are equal, round, and reactive to light.  Neck: Normal range of motion and phonation normal. Neck supple.  Cardiovascular: Normal rate, regular rhythm, normal heart sounds and intact distal pulses.   Pulmonary/Chest: Effort normal and breath sounds normal. He exhibits no bony tenderness.  Abdominal: Soft. Normal appearance. There is no tenderness.  Musculoskeletal: Normal range of motion.  Neurological: He is alert and oriented to person, place, and time. No cranial nerve deficit or sensory deficit. He exhibits normal muscle tone. Coordination normal.  Mild dysarthria. No ataxia. Bilateral rotary, and horizontal direction changing, nystagmus  Skin: Skin is warm, dry and intact.  Psychiatric: He has a normal mood and affect. His behavior is normal.  Judgment and thought content normal.    ED Course  Procedures (including critical care time)  Medications  0.9 %  sodium chloride infusion (125 mL/hr Intravenous New Bag/Given 09/25/13 1632)  sodium chloride 0.9 % bolus 500 mL (500 mLs Intravenous New Bag/Given 09/25/13 1632)    Patient Vitals for the past 24 hrs:  BP Temp Temp src Pulse Resp SpO2  09/25/13 1616 147/127 mmHg - - - 18 99 %  09/25/13 1530 153/101 mmHg 97.9 F (36.6 C) Oral - 21 99 %  09/25/13 1359 140/98 mmHg 98.7 F (37.1 C) Oral - 22 98 %  09/25/13 1154 149/95 mmHg 98.9 F (37.2 C) Oral - 24 98 %  09/25/13 0948 138/96 mmHg - - 95 - -  09/25/13 0946 133/91 mmHg - - 88 - -  09/25/13 0945 145/86 mmHg - - 86 - -  09/25/13 0829 149/98 mmHg - - - - 100 %  09/25/13 0828 142/94 mmHg - - - - 100 %  09/25/13 0820 164/99 mmHg - - 91 18 100 %  09/25/13 0819 169/113 mmHg - - 91 18 100 %   4:24 PM Reevaluation with update and discussion. After initial assessment and treatment, an updated evaluation reveals he has 4 episodes of syncope in the last 3 years, without clear diagnosis. Furthermore, he is being evaluated for gait instability for 3 months by his neurologist, at [facility]. He has also been anorexic for several weeks, and eating very little. His oral fluid intake is decreased, as well. Jennise Both L   4:30 PM-Consult complete with Dr. Forde Dandy. He, states the patient's baseline creatinine is 2.3 in August 2013. Patient case explained and discussed. He agrees to admit patient for further evaluation and treatment. Call ended at [place] - Abnormal; Notable for the following:    Glucose, Bld 106 (*)    BUN 39 (*)    Creatinine, Ser 2.51 (*)    GFR calc non Af Amer 26 (*)    GFR calc Af Amer 31 (*)    All other components within normal limits  CBC WITH DIFFERENTIAL - Abnormal; Notable for the following:    RBC 3.87 (*)    Hemoglobin 11.6 (*)    HCT 35.2 (*)    Platelets  123 (*)    Monocytes Relative 13 (*)    All other components within normal limits  URINALYSIS, ROUTINE W REFLEX MICROSCOPIC  CBC WITH DIFFERENTIAL   Imaging Review No results found.  EKG Interpretation    Date/Time:  Tuesday September 25 2013 08:27:14 EST Ventricular Rate:  89 PR Interval:  142 QRS Duration: 88 QT Interval:  387 QTC Calculation: 471 R Axis:   40 Text Interpretation:  Sinus rhythm since last tracing no significant change Confirmed by Swayzee Wadley  MD, Tra Wilemon ([id number]) on 09/25/2013 4:15:56 PM            MDM   1. Syncope   2. Anorexia      Syncope, with acute kidney injury as likely cause, secondary to volume depletion. Recurrent syncopal episodes. Complicated history with an unusual syndrome , Parkinson's like, idiopathic basal ganglia calcification. I think he is at high risk for recurrent falling. If he were to be discharged.   Nursing Notes Reviewed/ Care Coordinated, and agree without changes. Applicable Imaging Reviewed.  Interpretation of Laboratory Data incorporated into ED treatment  Plan: Admit    Richarda Blade, MD 09/25/13 1714

## 2013-09-25 NOTE — Consult Note (Signed)
Reason for Consult:Syncope Referring Physician: [place]  CC: Syncope  HPI: Darin SAMPSELL is an 58 y.o. male who got up on the day of admission and took a shower. He dried himself off and was getting dressed when his wife heard him hit the floor. He was found face down and was still breathing but lying still and not responding. She assessed him briefly and then called 911. 1-2 minutes elapsed and she came back to his side and he was still not responding. When EMS arrived very short while later he was pale but conscious.  Patient now at baseline.  He had a another syncopal episode at Thanksgiving where he was heading to the bathroom and had fecal incontinence and briefly lost consciousness. He was evaluated at that time but no etiology found.   Patient is followed by Dr. Doy Mince at [facility] for his [facility] syndrome.  Last MRI was performed this summer and there was felt to be no change in his calcifications.  Dr. Doy Mince had been made aware of the syncopal episode in November and the patient was scheduled for an EEG but it has not been completed.    Past Medical History  Diagnosis Date  . Heart transplanted   . Hypertension     Past Surgical History  Procedure Laterality Date  . Heart transplant      Family history: No family history of FAHR syndrome or other cerebral calcifications/PD  Social History:  reports that he has never smoked. He has never used smokeless tobacco. He reports that he does not drink alcohol or use illicit drugs.  Allergies  Allergen Reactions  . Peanuts [Peanut Oil] Anaphylaxis    Medications:  I have reviewed the patient's current medications. Prior to Admission:  Prescriptions prior to admission  Medication Sig Dispense Refill  . Ascorbic Acid (VITAMIN C PO) Take 1 tablet by mouth daily.      . carvedilol (COREG) 12.5 MG tablet Take 12.5 mg by mouth 2 (two) times daily.      . Cholecalciferol (VITAMIN D PO) Take 1 tablet by mouth daily.      Marland Kitchen  HYDROcodone-acetaminophen (NORCO/VICODIN) 5-325 MG per tablet Take 1 tablet by mouth 3 (three) times daily as needed. For pain      . levofloxacin (LEVAQUIN) 500 MG tablet Take 1 tablet by mouth daily. For 7 days, starting 09/19/2013      . Multiple Vitamin (MULTIVITAMIN WITH MINERALS) TABS tablet Take 1 tablet by mouth daily.      . pantoprazole (PROTONIX) 40 MG tablet Take 40 mg by mouth daily with breakfast.      . pravastatin (PRAVACHOL) 80 MG tablet Take 40 mg by mouth daily.      . tacrolimus (PROGRAF) 1 MG capsule Take 1-2 mg by mouth 2 (two) times daily. Take 2 capsules in AM and 1 capsule in PM       Scheduled: . [START ON 09/26/2013] carvedilol  12.5 mg Oral BID WC  . enoxaparin (LOVENOX) injection  40 mg Subcutaneous Q24H  . levofloxacin  500 mg Oral Daily  . multivitamin with minerals  1 tablet Oral Daily  . [START ON 09/26/2013] pantoprazole  40 mg Oral Q breakfast  . simvastatin  20 mg Oral q1800  . tacrolimus  1-2 mg Oral BID  . vitamin C  250 mg Oral Daily    ROS: History obtained from the patient  General ROS: negative for - chills, fatigue, fever, night sweats, weight gain or weight loss Psychological  ROS: negative for - behavioral disorder, hallucinations, memory difficulties, mood swings or suicidal ideation Ophthalmic ROS: negative for - blurry vision, double vision, eye pain or loss of vision ENT ROS: negative for - epistaxis, nasal discharge, oral lesions, sore throat, tinnitus or vertigo Allergy and Immunology ROS: negative for - hives or itchy/watery eyes Hematological and Lymphatic ROS: negative for - bleeding problems, bruising or swollen lymph nodes Endocrine ROS: negative for - galactorrhea, hair pattern changes, polydipsia/polyuria or temperature intolerance Respiratory ROS: recent URI with cough Cardiovascular ROS: negative for - chest pain, dyspnea on exertion, edema or irregular heartbeat Gastrointestinal ROS: negative for - abdominal pain, diarrhea,  hematemesis, nausea/vomiting or stool incontinence Genito-Urinary ROS: negative for - dysuria, hematuria, incontinence or urinary frequency/urgency Musculoskeletal ROS: negative for - joint swelling or muscular weakness Neurological ROS: as noted in HPI Dermatological ROS: negative for rash and skin lesion changes  Physical Examination: Blood pressure 136/89, pulse 86, temperature 99.3 F (37.4 C), temperature source Oral, resp. rate 18, height 6\' 1"  (1.854 m), weight 66.2 kg (145 lb 15.1 oz), SpO2 97.00%.  Neurologic Examination Mental Status: Alert, oriented, thought content appropriate.  Speech fluent without evidence of aphasia.  Able to follow 3 step commands without difficulty. Cranial Nerves: II: Discs flat bilaterally; Visual fields grossly normal, pupils equal, round, reactive to light and accommodation III,IV, VI: ptosis not present, extra-ocular motions intact bilaterally V,VII: smile symmetric, facial light touch sensation normal bilaterally VIII: hearing normal bilaterally IX,X: gag reflex present XI: bilateral shoulder shrug XII: midline tongue extension Motor: Right : Upper extremity   5/5    Left:     Upper extremity   5/5  Lower extremity   5/5     Lower extremity   5/5 Tone and bulk:normal tone throughout; no atrophy noted Sensory: Pinprick and light touch intact throughout, bilaterally Deep Tendon Reflexes: 2+ and symmetric throughout Plantars: Right: equivocal   Left: upgoing Cerebellar: normal finger-to-nose bilaterally.  Heel-to-shin testing with dysmetria using the RLE.  LLE testing normal.   Gait: Unable to test safely CV: pulses palpable throughout     Laboratory Studies:   Basic Metabolic Panel:  Recent Labs Lab 09/25/13 0951  NA 141  K 5.0  CL 100  CO2 29  GLUCOSE 106*  BUN 39*  CREATININE 2.51*  CALCIUM 9.4    Liver Function Tests: No results found for this basename: AST, ALT, ALKPHOS, BILITOT, PROT, ALBUMIN,  in the last 168 hours No  results found for this basename: LIPASE, AMYLASE,  in the last 168 hours No results found for this basename: AMMONIA,  in the last 168 hours  CBC:  Recent Labs Lab 09/25/13 1212  WBC 4.0  NEUTROABS 2.7  HGB 11.6*  HCT 35.2*  MCV 91.0  PLT 123*    Cardiac Enzymes:  Recent Labs Lab 09/25/13 1719  TROPONINI <0.30    BNP: No components found with this basename: POCBNP,   CBG: No results found for this basename: GLUCAP,  in the last 168 hours  Microbiology: No results found for this or any previous visit.  Coagulation Studies: No results found for this basename: LABPROT, INR,  in the last 72 hours  Urinalysis:  Recent Labs Lab 09/25/13 0957  COLORURINE YELLOW  LABSPEC 1.013  PHURINE 7.0  GLUCOSEU NEGATIVE  HGBUR NEGATIVE  BILIRUBINUR NEGATIVE  KETONESUR NEGATIVE  PROTEINUR NEGATIVE  UROBILINOGEN 0.2  NITRITE NEGATIVE  LEUKOCYTESUR NEGATIVE    Lipid Panel:     Component Value Date/Time   CHOL  Value: 127        ATP III CLASSIFICATION:  <200     mg/dL   Desirable  200-239  mg/dL   Borderline High  >=240    mg/dL   High        08/30/2010 1438   TRIG 58 08/30/2010 1438   HDL 49 08/30/2010 1438   CHOLHDL 2.6 08/30/2010 1438   VLDL 12 08/30/2010 1438   LDLCALC  Value: 66        Total Cholesterol/HDL:CHD Risk Coronary Heart Disease Risk Table                     Men   Women  1/2 Average Risk   3.4   3.3  Average Risk       5.0   4.4  2 X Average Risk   9.6   7.1  3 X Average Risk  23.4   11.0        Use the calculated Patient Ratio above and the CHD Risk Table to determine the patient's CHD Risk.        ATP III CLASSIFICATION (LDL):  <100     mg/dL   Optimal  100-129  mg/dL   Near or Above                    Optimal  130-159  mg/dL   Borderline  160-189  mg/dL   High  >190     mg/dL   Very High 08/30/2010 1438    HgbA1C:  Lab Results  Component Value Date   HGBA1C  Value: 5.7 (NOTE)                                                                       According  to the ADA Clinical Practice Recommendations for 2011, when HbA1c is used as a screening test:   >=6.5%   Diagnostic of Diabetes Mellitus           (if abnormal result  is confirmed)  5.7-6.4%   Increased risk of developing Diabetes Mellitus  References:Diagnosis and Classification of Diabetes Mellitus,Diabetes Care,2011,34(Suppl 1):S62-S69 and Standards of Medical Care in         Diabetes - 2011,Diabetes IONG,2952,84  (Suppl 1):S11-S61.* 08/30/2010    Urine Drug Screen:   No results found for this basename: labopia, cocainscrnur, labbenz, amphetmu, thcu, labbarb    Alcohol Level: No results found for this basename: ETH,  in the last 168 hours  Other results: EKG: sinus rhythm at 89 bpm.  Imaging: No results found.   Assessment/Plan: 58 year old male admitted after a syncopal episode.  Patient had no premonitory symptoms.  Had a previous episode at Thanksgiving as well.  Currently at baseline.  Seems to have a prolonged period of unconsciousness despite being on the floor.  Has a history of FAHR syndrome and although a parkinsonism can be associated with this syndrome, if syncope were related it would likely be secondary to orthostasis. BP's have been reviewed and although some BP drop has been seen with sitting and standing, it does not satisfy criteria for a true orthostasis.  With incontinence and prolonged unconsciousness, would rule out seizure as well.    Recommendations:  1.  EEG  Alexis Goodell, MD [facility] [phone] 09/25/2013, 11:04 PM

## 2013-09-25 NOTE — Progress Notes (Signed)
Unit CM UR Completed by [facility] ED CM  W. Naiomy Watters RN  

## 2013-09-26 DIAGNOSIS — R197 Diarrhea, unspecified: Secondary | ICD-10-CM

## 2013-09-26 LAB — CBC
HCT: 37.1 % — ABNORMAL LOW (ref 39.0–52.0)
HEMOGLOBIN: 12.1 g/dL — AB (ref 13.0–17.0)
MCH: 30.1 pg (ref 26.0–34.0)
MCHC: 32.6 g/dL (ref 30.0–36.0)
MCV: 92.3 fL (ref 78.0–100.0)
PLATELETS: 119 10*3/uL — AB (ref 150–400)
RBC: 4.02 MIL/uL — AB (ref 4.22–5.81)
RDW: 12.5 % (ref 11.5–15.5)
WBC: 3.2 10*3/uL — AB (ref 4.0–10.5)

## 2013-09-26 LAB — COMPREHENSIVE METABOLIC PANEL
ALBUMIN: 3.4 g/dL — AB (ref 3.5–5.2)
ALT: 12 U/L (ref 0–53)
AST: 21 U/L (ref 0–37)
Alkaline Phosphatase: 49 U/L (ref 39–117)
BILIRUBIN TOTAL: 0.5 mg/dL (ref 0.3–1.2)
BUN: 35 mg/dL — AB (ref 6–23)
CALCIUM: 8.8 mg/dL (ref 8.4–10.5)
CHLORIDE: 104 meq/L (ref 96–112)
CO2: 28 mEq/L (ref 19–32)
CREATININE: 2.19 mg/dL — AB (ref 0.50–1.35)
GFR calc Af Amer: 36 mL/min — ABNORMAL LOW (ref >90)
GFR, EST NON AFRICAN AMERICAN: 31 mL/min — AB (ref >90)
Glucose, Bld: 94 mg/dL (ref 70–99)
Potassium: 4.4 mEq/L (ref 3.7–5.3)
Sodium: 145 mEq/L (ref 137–147)
Total Protein: 6.7 g/dL (ref 6.0–8.3)

## 2013-09-26 LAB — TROPONIN I

## 2013-09-26 MED ORDER — DIPHENOXYLATE-ATROPINE 2.5-0.025 MG PO TABS: 1.0000 | ORAL_TABLET | Freq: Two times a day (BID) | ORAL | Status: DC | PRN

## 2013-09-26 MED ORDER — TACROLIMUS 1 MG PO CAPS
1.0000 mg | ORAL_CAPSULE | Freq: Every day | ORAL | Status: DC
  Filled 2013-09-26: qty 1

## 2013-09-26 MED ORDER — SIMVASTATIN 20 MG PO TABS: 20.0000 mg | ORAL_TABLET | Freq: Every day | ORAL | Status: DC

## 2013-09-26 MED ORDER — TACROLIMUS 1 MG PO CAPS
2.0000 mg | ORAL_CAPSULE | Freq: Every day | ORAL | Status: DC
  Administered 2013-09-26: 2 mg via ORAL
  Filled 2013-09-26: qty 2

## 2013-09-26 NOTE — Discharge Summary (Signed)
Physician Discharge Summary  Patient ID: Darin Vega MRN: [id number] DOB/AGE: 01/04/1956 58 y.o.  Admit date: 09/25/2013 Discharge date: 09/26/2013   Discharge Diagnoses:  Principal Problem:   Syncope Active Problems:   Diarrhea   CKD (chronic kidney disease) stage 3, GFR 30-59 ml/min   History of heart transplant   Fahr's disease   Discharged Condition: good  Hospital Course:  This is a 58 year old white male with a history of heart transplant, prior syncope, and FAHR syndrome. He was ill over the [place] Year's holiday with upper respiratory infection. He still has a bit of a congested cough. He was treated with Levaquin. His by mouth intake has been fair. He said no GI symptoms. he got up and took a shower this morning. He dried himself off and was getting dressed when his wife heard his blood. He was found face down and was still breathing but lying still and not responding. She assessed him briefly and then called 911. 1-2 minutes elapsed and she came back to his side and he was still not responding. When EMS arrived very short while later he was pale but now conscious. He had no prodrome prior this. He had no neurologic weakness in any new way up on the first of analysis. He had no hypotension. He was sweaty and pale. He feels back to his normal baseline at the moment. He not eaten anything at this moment. He not taken his medications yet. He had no nausea. He had a another syncopal episode at Thanksgiving where he was heading to the bathroom and had fecal incontinence and briefly lost consciousness. He was evaluated at that time. His gait is chronically unstable and not different. He's lost a couple pounds with this illness. He's not been exposed to any other unusual illnesses. He's had no cardiac symptoms. He notes no chest pain or shortness of breath. His gait is unsteady was had no falls and no head injury. His left shoulder is a bit sore at the present. He has no memory of either these  episodes and remembers no prodrome.  He did well overnight with serial cardiac enzymes normal and no serious arrhythmia on telemetry. He had already planned on a full workup for syncope tomorrow, including an EEG, so we are discharging him to the care of his wife today.  Consults: neurology  Significant Diagnostic Studies:  No results found.  Labs: Lab Results  Component Value Date   WBC 3.2* 09/26/2013   HGB 12.1* 09/26/2013   HCT 37.1* 09/26/2013   MCV 92.3 09/26/2013   PLT 119* 09/26/2013     Recent Labs Lab 09/26/13 0515  NA 145  K 4.4  CL 104  CO2 28  BUN 35*  CREATININE 2.19*  CALCIUM 8.8  PROT 6.7  BILITOT 0.5  ALKPHOS 49  ALT 12  AST 21  GLUCOSE 94       Lab Results  Component Value Date   INR 0.98 08/30/2010   INR 1.0 08/12/2007     No results found for this or any previous visit (from the past 240 hour(s)).    Discharge Exam: Blood pressure 138/91, pulse 83, temperature 97.9 F (36.6 C), temperature source Oral, resp. rate 17, height 6\' 1"  (1.854 m), weight 66.2 kg (145 lb 15.1 oz), SpO2 99.00%.  Physical Exam: In general, he is a well nourished well developed man in no apparent distress, HEENT exam was normal, neck was without JVD or bruit, chest was clear, heart had a regular  rate and rhythm, abdomen was benign, extremities were without edema, Neuro exam was significant for decreased shortterm memory with a mildly wide based gait.  Disposition:He will be discharged to home today to have his syncope evaluation at [facility], as planned.  Discharge Orders   Future Orders Complete By Expires   Call MD for:  As directed    Comments:     Call for fever, chills, difficulty breathing, recurrent dizziness, or other concerning symptoms   Diet - low sodium heart healthy  As directed    Discharge instructions  As directed    Comments:     Try stopping the pantoprazole as this could be contributing to diarrhea, use lomotil before lunch and supper if  needed to help with diarrhea. Have the fainting workup done tomorrow at [facility] as planned   Increase activity slowly  As directed        Medication List    STOP taking these medications       pantoprazole 40 MG tablet  Commonly known as:  PROTONIX      TAKE these medications       carvedilol 12.5 MG tablet  Commonly known as:  COREG  Take 12.5 mg by mouth 2 (two) times daily.     diphenoxylate-atropine 2.5-0.025 MG per tablet  Commonly known as:  LOMOTIL  Take 1 tablet by mouth 2 (two) times daily between meals as needed for diarrhea or loose stools.     HYDROcodone-acetaminophen 5-325 MG per tablet  Commonly known as:  NORCO/VICODIN  Take 1 tablet by mouth 3 (three) times daily as needed. For pain     levofloxacin 500 MG tablet  Commonly known as:  LEVAQUIN  Take 1 tablet by mouth daily. For 7 days, starting 09/19/2013     multivitamin with minerals Tabs tablet  Take 1 tablet by mouth daily.     pravastatin 80 MG tablet  Commonly known as:  PRAVACHOL  Take 40 mg by mouth daily.     tacrolimus 1 MG capsule  Commonly known as:  PROGRAF  Take 1-2 mg by mouth 2 (two) times daily. Take 2 capsules in AM and 1 capsule in PM     VITAMIN C PO  Take 1 tablet by mouth daily.     VITAMIN D PO  Take 1 tablet by mouth daily.         Signed: Donnajean Lopes 09/26/2013, 8:26 AM

## 2013-09-26 NOTE — Progress Notes (Signed)
Subjective: He feels fine with no chest discomfort, palpitations, dyspnea, or lapses in consciousness overnight. Tomorrow a workup for syncope had been planned at [facility] that is supposed to include an EEG as well as cardiac testing  Objective: Vital signs in last 24 hours: Temp:  [97.9 F (36.6 C)-99.3 F (37.4 C)] 97.9 F (36.6 C) (01/07 0517) Pulse Rate:  [83-95] 83 (01/07 0517) Resp:  [17-24] 17 (01/07 0517) BP: (133-169)/(86-127) 138/91 mmHg (01/07 0517) SpO2:  [97 %-100 %] 99 % (01/07 0517) Weight:  [66.2 kg (145 lb 15.1 oz)] 66.2 kg (145 lb 15.1 oz) (01/06 2215) Weight change:    Intake/Output from previous day: 01/06 0701 - 01/07 0700 In: -  Out: 1725 [Urine:1725]   General appearance: alert, cooperative and no distress Head: Normocephalic, without obvious abnormality, atraumatic Eyes: conjunctivae/corneas clear. PERRL, EOM's intact. Fundi benign. Resp: clear to auscultation bilaterally Cardio: regular rate and rhythm GI: soft, non-tender; bowel sounds normal; no masses,  no organomegaly Extremities: extremities normal, atraumatic, no cyanosis or edema Neurologic: Grossly normal  Lab Results:  Recent Labs  09/25/13 1212 09/26/13 0515  WBC 4.0 3.2*  HGB 11.6* 12.1*  HCT 35.2* 37.1*  PLT 123* 119*   BMET  Recent Labs  09/25/13 0951 09/26/13 0515  NA 141 145  K 5.0 4.4  CL 100 104  CO2 29 28  GLUCOSE 106* 94  BUN 39* 35*  CREATININE 2.51* 2.19*  CALCIUM 9.4 8.8   CMET CMP     Component Value Date/Time   NA 145 09/26/2013 0515   K 4.4 09/26/2013 0515   CL 104 09/26/2013 0515   CO2 28 09/26/2013 0515   GLUCOSE 94 09/26/2013 0515   BUN 35* 09/26/2013 0515   CREATININE 2.19* 09/26/2013 0515   CALCIUM 8.8 09/26/2013 0515   PROT 6.7 09/26/2013 0515   ALBUMIN 3.4* 09/26/2013 0515   AST 21 09/26/2013 0515   ALT 12 09/26/2013 0515   ALKPHOS 49 09/26/2013 0515   BILITOT 0.5 09/26/2013 0515   GFRNONAA 31* 09/26/2013 0515   GFRAA 36* 09/26/2013 0515    CBG (last 3)  No  results found for this basename: GLUCAP,  in the last 72 hours  INR RESULTS:   Lab Results  Component Value Date   INR 0.98 08/30/2010   INR 1.0 08/12/2007     Studies/Results: No results found.  Medications: I have reviewed the patient's current medications.  Assessment/Plan: #1 Syncope: clinically stable with no serious arrhythmia overnight and cardiac enzymes negative. Will discuss with his wife, Janace Hoard, whether they want to hold off of an EEG here given that he was planning to have this done at [facility] along with other tests for his episodes of brief LOC. #2 CKD: stable on IVF   LOS: 1 day   Sahirah Rudell G 09/26/2013, 8:08 AM

## 2014-02-08 ENCOUNTER — Other Ambulatory Visit: Payer: Self-pay | Admitting: Internal Medicine

## 2014-02-08 ENCOUNTER — Ambulatory Visit
Admission: RE | Admit: 2014-02-08 | Discharge: 2014-02-08 | Disposition: A | Discharge status: Home / Self Care | Payer: 59 | Source: Ambulatory Visit | Attending: Internal Medicine | Admitting: Internal Medicine

## 2014-02-08 DIAGNOSIS — J301 Allergic rhinitis due to pollen: Secondary | ICD-10-CM

## 2014-02-08 DIAGNOSIS — R51 Headache: Secondary | ICD-10-CM

## 2014-02-08 DIAGNOSIS — D899 Disorder involving the immune mechanism, unspecified: Secondary | ICD-10-CM

## 2014-05-13 DIAGNOSIS — J329 Chronic sinusitis, unspecified: Secondary | ICD-10-CM

## 2014-05-13 HISTORY — DX: Chronic sinusitis, unspecified: J32.9

## 2014-07-12 ENCOUNTER — Encounter (HOSPITAL_COMMUNITY): Payer: Self-pay | Admitting: Emergency Medicine

## 2014-07-12 ENCOUNTER — Emergency Department (HOSPITAL_COMMUNITY)
Admission: EM | Admit: 2014-07-12 | Discharge: 2014-07-12 | Disposition: A | Discharge status: Home / Self Care | Payer: 59 | Attending: Emergency Medicine | Admitting: Emergency Medicine

## 2014-07-12 DIAGNOSIS — Z941 Heart transplant status: Secondary | ICD-10-CM | POA: Diagnosis not present

## 2014-07-12 DIAGNOSIS — R55 Syncope and collapse: Secondary | ICD-10-CM | POA: Diagnosis present

## 2014-07-12 DIAGNOSIS — G43909 Migraine, unspecified, not intractable, without status migrainosus: Secondary | ICD-10-CM | POA: Insufficient documentation

## 2014-07-12 DIAGNOSIS — I509 Heart failure, unspecified: Secondary | ICD-10-CM | POA: Diagnosis not present

## 2014-07-12 DIAGNOSIS — Z8719 Personal history of other diseases of the digestive system: Secondary | ICD-10-CM | POA: Diagnosis not present

## 2014-07-12 DIAGNOSIS — Z7982 Long term (current) use of aspirin: Secondary | ICD-10-CM | POA: Diagnosis not present

## 2014-07-12 DIAGNOSIS — I10 Essential (primary) hypertension: Secondary | ICD-10-CM | POA: Diagnosis not present

## 2014-07-12 DIAGNOSIS — Z79899 Other long term (current) drug therapy: Secondary | ICD-10-CM | POA: Insufficient documentation

## 2014-07-12 LAB — I-STAT CHEM 8, ED
BUN: 34 mg/dL — ABNORMAL HIGH (ref 6–23)
CHLORIDE: 106 meq/L (ref 96–112)
CREATININE: 2.1 mg/dL — AB (ref 0.50–1.35)
Calcium, Ion: 1.26 mmol/L (ref 1.13–1.30)
GLUCOSE: 112 mg/dL — AB (ref 70–99)
HEMATOCRIT: 41 % (ref 39.0–52.0)
Hemoglobin: 13.9 g/dL (ref 13.0–17.0)
POTASSIUM: 4.3 meq/L (ref 3.7–5.3)
Sodium: 140 mEq/L (ref 137–147)
TCO2: 23 mmol/L (ref 0–100)

## 2014-07-12 LAB — CBC
HEMATOCRIT: 38.2 % — AB (ref 39.0–52.0)
HEMOGLOBIN: 12.6 g/dL — AB (ref 13.0–17.0)
MCH: 30 pg (ref 26.0–34.0)
MCHC: 33 g/dL (ref 30.0–36.0)
MCV: 91 fL (ref 78.0–100.0)
Platelets: 162 10*3/uL (ref 150–400)
RBC: 4.2 MIL/uL — AB (ref 4.22–5.81)
RDW: 12.7 % (ref 11.5–15.5)
WBC: 7 10*3/uL (ref 4.0–10.5)

## 2014-07-12 MED ORDER — EPINEPHRINE 0.3 MG/0.3ML IJ SOAJ: 0.3000 mg | Freq: Once | INTRAMUSCULAR | Status: DC

## 2014-07-12 NOTE — ED Notes (Signed)
Per EMS pt was at cycling class and was getting off cycle, felt lightheaded and had near syncopal episode, family assisted patient into chair. Pt was pale and diaphoretic. Pt was positive for orthostatic.

## 2014-07-12 NOTE — ED Provider Notes (Signed)
CSN: [id number]     Arrival date & time 07/12/14  1306 History   First MD Initiated Contact with Patient 07/12/14 1326     Chief Complaint  Patient presents with  . Near Syncope     (Consider location/radiation/quality/duration/timing/severity/associated sxs/prior Treatment) HPI 58 year old male with history of heart transplant, progressive neurologic deterioration with memory loss and chronic nystagmus, history of syncope recurrent, with a bicycle spinning class today and rode the bike without difficulty however about 10 minutes after he finished a spinning class he developed lightheadedness with several minutes of presyncope with no headache no change in speech or vision no chest pain no shortness of breath no lateralizing weakness or numbness. He feels back to baseline now. Past Medical History  Diagnosis Date  . Heart transplanted   . Hypertension   . CHF (congestive heart failure)     "before heart transplant in 2005  . History of stomach ulcers     "before heart transplant"  . Fahr's syndrome   . Headache(784.0)     "usually have one q other day; attributed to Fahr's syndrome" (09/25/2013)  . Migraine     "don't get them often anymore" (09/25/2013)  . Syncope and collapse ~ 2010; 2011; 07/2013; 09/25/2013   Past Surgical History  Procedure Laterality Date  . Heart transplant  04/2004  . Cardiac defibrillator placement  10/2001; 06/2003    "took 2nd one out before heart transplant in 04/2004"  . Cataract extraction w/ intraocular lens  implant, bilateral Bilateral ?2009   No family history on file. History  Substance Use Topics  . Smoking status: Never Smoker   . Smokeless tobacco: Never Used  . Alcohol Use: No    Review of Systems 10 Systems reviewed and are negative for acute change except as noted in the HPI.   Allergies  Peanuts  Home Medications   Prior to Admission medications   Medication Sig Start Date End Date Taking? Authorizing Provider  Ascorbic Acid  (VITAMIN C PO) Take 1 tablet by mouth daily.   Yes Historical Provider, MD  aspirin EC 81 MG tablet Take 81 mg by mouth daily.   Yes Historical Provider, MD  Calcium Citrate-Vitamin D (CALCIUM CITRATE + PO) Take 1,000 mg by mouth 2 (two) times daily.   Yes Historical Provider, MD  carvedilol (COREG) 12.5 MG tablet Take 12.5 mg by mouth 2 (two) times daily. 09/18/13  Yes Historical Provider, MD  Cholecalciferol (VITAMIN D PO) Take 1 tablet by mouth daily.   Yes Historical Provider, MD  diphenoxylate-atropine (LOMOTIL) 2.5-0.025 MG per tablet Take 1 tablet by mouth 2 (two) times daily between meals as needed for diarrhea or loose stools. 09/26/13  Yes Leanna Battles, MD  hydrALAZINE (APRESOLINE) 25 MG tablet Take 25 mg by mouth 3 (three) times daily.   Yes Historical Provider, MD  HYDROcodone-acetaminophen (NORCO/VICODIN) 5-325 MG per tablet Take 1 tablet by mouth 3 (three) times daily as needed. For pain 07/27/13  Yes Historical Provider, MD  OVER THE COUNTER MEDICATION Take 1 tablet by mouth 3 (three) times daily. [facility] Multi-Vitamin   Yes Historical Provider, MD  pantoprazole (PROTONIX) 40 MG tablet Take 40 mg by mouth every other day.   Yes Historical Provider, MD  pravastatin (PRAVACHOL) 40 MG tablet Take 20 mg by mouth daily.   Yes Historical Provider, MD  tacrolimus (PROGRAF) 1 MG capsule Take 1-2 mg by mouth 2 (two) times daily. Take 2 capsules in AM and 1 capsule in  PM 09/04/13  Yes Historical Provider, MD  EPINEPHrine 0.3 mg/0.3 mL IJ SOAJ injection Inject 0.3 mLs (0.3 mg total) into the muscle once. 07/12/14   Babette Relic, MD   BP 147/91 mmHg  Pulse 87  Temp(Src) 98.3 F (36.8 C) (Oral)  Resp 14  SpO2 97% Physical Exam  Nursing note and vitals reviewed. Constitutional:  Awake, alert, nontoxic appearance with baseline speech for patient.  HENT:  Head: Atraumatic.  Mouth/Throat: No oropharyngeal exudate.  Eyes: EOM are normal. Pupils are equal, round, and  reactive to light. Right eye exhibits no discharge. Left eye exhibits no discharge.  Neck: Neck supple.  Cardiovascular: Normal rate and regular rhythm.   No murmur heard. Pulmonary/Chest: Effort normal and breath sounds normal. No stridor. No respiratory distress. He has no wheezes. He has no rales. He exhibits no tenderness.  Abdominal: Soft. Bowel sounds are normal. He exhibits no mass. There is no tenderness. There is no rebound.  Musculoskeletal: He exhibits no tenderness.  Baseline ROM, moves extremities with no obvious new focal weakness.  Lymphadenopathy:    He has no cervical adenopathy.  Neurological: He is alert.  Awake, alert, cooperative and aware of situation; motor strength bilaterally; sensation normal to light touch bilaterally; peripheral visual fields full to confrontation; no facial asymmetry; tongue midline; major cranial nerves appear intact; no pronator drift, normal finger to nose bilaterally; negative test of skew; baseline horizontal chronic nystagmus present  Skin: No rash noted.  Psychiatric: He has a normal mood and affect.    ED Course  Procedures (including critical care time) D/w [facility] transplant team recs discharge they will f/u with Pt next week; Pt's wife to call [facility] if Pt has recurrent near syncope/syncope. 1350 Patient / Family / Caregiver informed of clinical course, understand medical decision-making process, and agree with plan. Labs Review Labs Reviewed  CBC - Abnormal; Notable for the following:    RBC 4.20 (*)    Hemoglobin 12.6 (*)    HCT 38.2 (*)    All other components within normal limits  I-STAT CHEM 8, ED - Abnormal; Notable for the following:    BUN 34 (*)    Creatinine, Ser 2.10 (*)    Glucose, Bld 112 (*)    All other components within normal limits    Imaging Review No results found.   EKG Interpretation   Date/Time:  Friday July 12 2014 13:09:05 EDT Ventricular Rate:  98 PR Interval:  155 QRS Duration: 89 QT  Interval:  360 QTC Calculation: 460 R Axis:   -3 Text Interpretation:  Sinus rhythm Probable left atrial enlargement RSR'  in V1 or V2, probably normal variant No significant change since last  tracing Confirmed by [facility]  MD, Jenny Reichmann ([id number]) on 07/12/2014 1:41:33 PM      MDM   Final diagnoses:  Near syncope    I doubt any other EMC precluding discharge at this time including, but not necessarily limited to the following:CVA, [place], Vtach.    Babette Relic, MD 07/21/14 2127

## 2014-07-12 NOTE — Discharge Instructions (Signed)
Called [facility] cardiac transplant team if you have any recurrent fainting spells or near fainting spells or other concerns.  Your caregiver has seen you today because you are having problems with feelings of weakness, dizziness, and/or fatigue. Weakness has many different causes, some of which are common and others are very rare. Your caregiver has considered some of the most common causes of weakness and feels it is safe for you to go home and be observed. Not every illness or injury can be identified during an emergency department visit, thus follow-up with your primary healthcare provider is important. Medical conditions can also worsen, so it is also important to return immediately as directed below, or if you have other serious concerns develop. RETURN IMMEDIATELY IF you develop new shortness of breath, chest pain, fever, have difficulty moving parts of your body (new weakness, numbness, or incoordination), sudden change in speech, vision, swallowing, or understanding, faint or develop new dizziness, severe headache, become poorly responsive or have an altered mental status compared to baseline for you, new rash, abdominal pain, or bloody stools,  Return sooner also if you develop new problems for which you have not talked to your caregiver but you feel may be emergency medical conditions, or are unable to be cared for safely at home.

## 2014-12-14 ENCOUNTER — Emergency Department (HOSPITAL_COMMUNITY)
Admission: EM | Admit: 2014-12-14 | Discharge: 2014-12-14 | Disposition: A | Discharge status: Home / Self Care | Payer: 59 | Attending: Emergency Medicine | Admitting: Emergency Medicine

## 2014-12-14 ENCOUNTER — Emergency Department (HOSPITAL_COMMUNITY): Payer: 59

## 2014-12-14 ENCOUNTER — Encounter (HOSPITAL_COMMUNITY): Payer: Self-pay | Admitting: Emergency Medicine

## 2014-12-14 DIAGNOSIS — R55 Syncope and collapse: Secondary | ICD-10-CM | POA: Diagnosis not present

## 2014-12-14 DIAGNOSIS — Z941 Heart transplant status: Secondary | ICD-10-CM | POA: Insufficient documentation

## 2014-12-14 DIAGNOSIS — Z8669 Personal history of other diseases of the nervous system and sense organs: Secondary | ICD-10-CM | POA: Insufficient documentation

## 2014-12-14 DIAGNOSIS — I10 Essential (primary) hypertension: Secondary | ICD-10-CM | POA: Insufficient documentation

## 2014-12-14 DIAGNOSIS — Z8719 Personal history of other diseases of the digestive system: Secondary | ICD-10-CM | POA: Diagnosis not present

## 2014-12-14 DIAGNOSIS — I509 Heart failure, unspecified: Secondary | ICD-10-CM | POA: Diagnosis not present

## 2014-12-14 DIAGNOSIS — Z7982 Long term (current) use of aspirin: Secondary | ICD-10-CM | POA: Diagnosis not present

## 2014-12-14 DIAGNOSIS — Z79899 Other long term (current) drug therapy: Secondary | ICD-10-CM | POA: Insufficient documentation

## 2014-12-14 LAB — BASIC METABOLIC PANEL
Anion gap: 9 (ref 5–15)
BUN: 42 mg/dL — ABNORMAL HIGH (ref 6–23)
CALCIUM: 9.9 mg/dL (ref 8.4–10.5)
CHLORIDE: 104 mmol/L (ref 96–112)
CO2: 26 mmol/L (ref 19–32)
CREATININE: 2.39 mg/dL — AB (ref 0.50–1.35)
GFR calc Af Amer: 32 mL/min — ABNORMAL LOW (ref >90)
GFR calc non Af Amer: 28 mL/min — ABNORMAL LOW (ref >90)
GLUCOSE: 108 mg/dL — AB (ref 70–99)
POTASSIUM: 4.3 mmol/L (ref 3.5–5.1)
SODIUM: 139 mmol/L (ref 135–145)

## 2014-12-14 LAB — CBC WITH DIFFERENTIAL/PLATELET
Basophils Absolute: 0.1 10*3/uL (ref 0.0–0.1)
Basophils Relative: 1 % (ref 0–1)
EOS PCT: 4 % (ref 0–5)
Eosinophils Absolute: 0.3 10*3/uL (ref 0.0–0.7)
HCT: 38.9 % — ABNORMAL LOW (ref 39.0–52.0)
Hemoglobin: 13.1 g/dL (ref 13.0–17.0)
LYMPHS ABS: 0.9 10*3/uL (ref 0.7–4.0)
Lymphocytes Relative: 10 % — ABNORMAL LOW (ref 12–46)
MCH: 30.6 pg (ref 26.0–34.0)
MCHC: 33.7 g/dL (ref 30.0–36.0)
MCV: 90.9 fL (ref 78.0–100.0)
MONO ABS: 0.4 10*3/uL (ref 0.1–1.0)
Monocytes Relative: 5 % (ref 3–12)
Neutro Abs: 6.8 10*3/uL (ref 1.7–7.7)
Neutrophils Relative %: 80 % — ABNORMAL HIGH (ref 43–77)
Platelets: 161 10*3/uL (ref 150–400)
RBC: 4.28 MIL/uL (ref 4.22–5.81)
RDW: 12.9 % (ref 11.5–15.5)
WBC: 8.4 10*3/uL (ref 4.0–10.5)

## 2014-12-14 LAB — URINALYSIS, ROUTINE W REFLEX MICROSCOPIC
BILIRUBIN URINE: NEGATIVE
Glucose, UA: NEGATIVE mg/dL
HGB URINE DIPSTICK: NEGATIVE
Ketones, ur: NEGATIVE mg/dL
LEUKOCYTES UA: NEGATIVE
NITRITE: NEGATIVE
PH: 7.5 (ref 5.0–8.0)
PROTEIN: NEGATIVE mg/dL
SPECIFIC GRAVITY, URINE: 1.009 (ref 1.005–1.030)
Urobilinogen, UA: 0.2 mg/dL (ref 0.0–1.0)

## 2014-12-14 LAB — TROPONIN I

## 2014-12-14 NOTE — ED Provider Notes (Signed)
CSN: [id number]     Arrival date & time 12/14/14  1130 History   First MD Initiated Contact with Patient 12/14/14 1137     Chief Complaint  Patient presents with  . Loss of Consciousness     (Consider location/radiation/quality/duration/timing/severity/associated sxs/prior Treatment) Patient is a 59 y.o. male presenting with syncope.  Loss of Consciousness Episode history:  Single Most recent episode:  Today Duration: unclear, several minutes. "it felt like an hour" - wife. Timing:  Constant Progression:  Resolved Chronicity:  Recurrent (two similar episodes in last 6 months) Context comment:  He had ordered food and was starting to eat when he began to feel bad.  He went to the bathroom a few times and had a normal consistency bowel movement.  When he came back to the table, he went unresponsive next to his wife.  Witnessed: yes   Relieved by:  Nothing Associated symptoms: no chest pain, no nausea and no shortness of breath   Associated symptoms comment:  Wife reports that his speech was slurred immediately after regaining consciousness and has since returned to normal.   Past Medical History  Diagnosis Date  . Heart transplanted   . Hypertension   . CHF (congestive heart failure)     "before heart transplant in 2005  . History of stomach ulcers     "before heart transplant"  . Fahr's syndrome   . Headache(784.0)     "usually have one q other day; attributed to Fahr's syndrome" (09/25/2013)  . Migraine     "don't get them often anymore" (09/25/2013)  . Syncope and collapse ~ 2010; 2011; 07/2013; 09/25/2013   Past Surgical History  Procedure Laterality Date  . Heart transplant  04/2004  . Cardiac defibrillator placement  10/2001; 06/2003    "took 2nd one out before heart transplant in 04/2004"  . Cataract extraction w/ intraocular lens  implant, bilateral Bilateral ?2009   History reviewed. No pertinent family history. History  Substance Use Topics  . Smoking status: Never  Smoker   . Smokeless tobacco: Never Used  . Alcohol Use: No    Review of Systems  Respiratory: Negative for shortness of breath.   Cardiovascular: Positive for syncope. Negative for chest pain.  Gastrointestinal: Negative for nausea.  All other systems reviewed and are negative.     Allergies  Peanuts  Home Medications   Prior to Admission medications   Medication Sig Start Date End Date Taking? Authorizing Provider  Ascorbic Acid (VITAMIN C PO) Take 1 tablet by mouth daily.    Historical Provider, MD  aspirin EC 81 MG tablet Take 81 mg by mouth daily.    Historical Provider, MD  Calcium Citrate-Vitamin D (CALCIUM CITRATE + PO) Take 1,000 mg by mouth 2 (two) times daily.    Historical Provider, MD  carvedilol (COREG) 12.5 MG tablet Take 12.5 mg by mouth 2 (two) times daily. 09/18/13   Historical Provider, MD  Cholecalciferol (VITAMIN D PO) Take 1 tablet by mouth daily.    Historical Provider, MD  diphenoxylate-atropine (LOMOTIL) 2.5-0.025 MG per tablet Take 1 tablet by mouth 2 (two) times daily between meals as needed for diarrhea or loose stools. 09/26/13   Leanna Battles, MD  EPINEPHrine 0.3 mg/0.3 mL IJ SOAJ injection Inject 0.3 mLs (0.3 mg total) into the muscle once. 07/12/14   Riki Altes, MD  hydrALAZINE (APRESOLINE) 25 MG tablet Take 25 mg by mouth 3 (three) times daily.    Historical Provider, MD  HYDROcodone-acetaminophen (NORCO/VICODIN) 5-325 MG  per tablet Take 1 tablet by mouth 3 (three) times daily as needed. For pain 07/27/13   Historical Provider, MD  OVER THE COUNTER MEDICATION Take 1 tablet by mouth 3 (three) times daily. [facility] Life Essentials Multi-Vitamin    Historical Provider, MD  pantoprazole (PROTONIX) 40 MG tablet Take 40 mg by mouth every other day.    Historical Provider, MD  pravastatin (PRAVACHOL) 40 MG tablet Take 20 mg by mouth daily.    Historical Provider, MD  tacrolimus (PROGRAF) 1 MG capsule Take 1-2 mg by mouth 2 (two) times daily. Take 2  capsules in AM and 1 capsule in PM 09/04/13   Historical Provider, MD   BP 164/107 mmHg  Pulse 96  Resp 16  SpO2 100% Physical Exam  Constitutional: He is oriented to person, place, and time. He appears well-developed and well-nourished. No distress.  HENT:  Head: Normocephalic and atraumatic.  Mouth/Throat: Oropharynx is clear and moist.  Eyes: Conjunctivae are normal. Pupils are equal, round, and reactive to light. No scleral icterus.  Neck: Neck supple.  Cardiovascular: Normal rate, regular rhythm, normal heart sounds and intact distal pulses.   No murmur heard. Pulmonary/Chest: Effort normal and breath sounds normal. No stridor. No respiratory distress. He has no wheezes. He has no rales.  Abdominal: Soft. He exhibits no distension. There is no tenderness.  Musculoskeletal: Normal range of motion. He exhibits no edema.  Neurological: He is alert and oriented to person, place, and time.  Skin: Skin is warm and dry. No rash noted.  Psychiatric: He has a normal mood and affect. His behavior is normal.  Nursing note and vitals reviewed.   ED Course  Procedures (including critical care time) Labs Review Labs Reviewed  CBC WITH DIFFERENTIAL/PLATELET - Abnormal; Notable for the following:    HCT 38.9 (*)    Neutrophils Relative % 80 (*)    Lymphocytes Relative 10 (*)    All other components within normal limits  BASIC METABOLIC PANEL - Abnormal; Notable for the following:    Glucose, Bld 108 (*)    BUN 42 (*)    Creatinine, Ser 2.39 (*)    GFR calc non Af Amer 28 (*)    GFR calc Af Amer 32 (*)    All other components within normal limits  URINALYSIS, ROUTINE W REFLEX MICROSCOPIC  TROPONIN I    Imaging Review Ct Head Wo Contrast  12/14/2014   CLINICAL DATA:  Patient with frequent episodes of syncope. History of Fahr's syndrome.  EXAM: CT HEAD WITHOUT CONTRAST  TECHNIQUE: Contiguous axial images were obtained from the base of the skull through the vertex without intravenous  contrast.  COMPARISON:  Maxillofacial CT 02/08/2014 ; MR brain 04/16/2004  FINDINGS: Re- demonstrated extensive calcification involving the parenchyma. This involves the cerebellar white matter bilaterally, the thalmi and periventricular white matter. 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 No evidence for acute cortically based infarct, intracranial hemorrhage, mass lesion or mass effect. Ventricles and sulci are appropriate for patient's age. The orbits are unremarkable. Paranasal sinuses are well aerated. Mild mucosal thickening right maxillary sinus. Mastoid air cells are unremarkable. Calvarium is intact.  IMPRESSION: No acute intracranial process.  Extensive calcification compatible with history of Fahrs syndrome.   Electronically Signed   By: Lovey Newcomer M.D.   On: 12/14/2014 12:58   Dg Chest Port 1 View  12/14/2014   CLINICAL DATA:  Syncope.  Hypertension.  EXAM: PORTABLE CHEST - 1 VIEW  COMPARISON:  August 30, 2010  FINDINGS: There is no edema or consolidation. Patient is status post median sternotomy. The heart size and pulmonary vascularity are normal. There are wires attached to the right heart. No adenopathy. No bone lesions.  IMPRESSION: Pacer wires attached to right heart.  No edema or consolidation.   Electronically Signed   By: Lowella Grip III M.D.   On: 12/14/2014 12:36  All radiology studies independently viewed by me.      EKG Interpretation   Date/Time:  Saturday December 14 2014 11:34:55 EDT Ventricular Rate:  97 PR Interval:  162 QRS Duration: 87 QT Interval:  351 QTC Calculation: 446 R Axis:   34 Text Interpretation:  Sinus rhythm Anteroseptal infarct, age indeterminate  No significant change was found Confirmed by [facility]  MD, TREY ([id number]) on  12/14/2014 1:25:33 PM      MDM   Final diagnoses:  Syncope    60 yo male with hx of Fahr syndrome and heart transplant who presented after an episode of syncope.  He had prodromal features and passed  out in a chair next to his wife.  No falls or injuries associated with this syncope.  He has had two very similar episodes in the past year.  He had an episode of slurred speech just after recovering consciousness.  Neurologically baseline with normal speech in ED.  I suspect this was secondary to his syncope, not TIA or CVA.  No apparent seizure like activity.  Labs, EKG, and imaging all unremarkable and patient was back to feeling well by the time he got to the ED.  I spoke with his cardiology team (specifically the CCU fellow, who spoke directly with patient's cardiologist).  Given his well appearance, stable labs, and recurrent nature of syncope, it was felt that he was stable for outpatient follow up.  Pt comfortable with this plan and given return precautions.     Serita Grit, MD 12/15/14 [phone]

## 2014-12-14 NOTE — ED Notes (Addendum)
Pt via [facility] with c/o syncope with no symptoms prior to or after event.  Pt was sitting at time of syncope, denies hitting head.  Pt reports x 3 episodes since October with no dx.  Cardiology and neurology think it may be fahr's Syndrome.  On EMS arrival BP 80/40, placed in trendelenburg and given 300 mL NS, next pressure was 146/81.  Hx heart transplant Aug 2005.  Pt NAD, A&Ox4.

## 2014-12-14 NOTE — Discharge Instructions (Signed)
Syncope °Syncope is a medical term for fainting or passing out. This means you lose consciousness and drop to the ground. People are generally unconscious for less than 5 minutes. You may have some muscle twitches for up to 15 seconds before waking up and returning to normal. Syncope occurs more often in older adults, but it can happen to anyone. While most causes of syncope are not dangerous, syncope can be a sign of a serious medical problem. It is important to seek medical care.  °CAUSES  °Syncope is caused by a sudden drop in blood flow to the brain. The specific cause is often not determined. Factors that can bring on syncope include: °· Taking medicines that lower blood pressure. °· Sudden changes in posture, such as standing up quickly. °· Taking more medicine than prescribed. °· Standing in one place for too long. °· Seizure disorders. °· Dehydration and excessive exposure to heat. °· Low blood sugar (hypoglycemia). °· Straining to have a bowel movement. °· Heart disease, irregular heartbeat, or other circulatory problems. °· Fear, emotional distress, seeing blood, or severe pain. °SYMPTOMS  °Right before fainting, you may: °· Feel dizzy or light-headed. °· Feel nauseous. °· See all white or all black in your field of vision. °· Have cold, clammy skin. °DIAGNOSIS  °Your health care provider will ask about your symptoms, perform a physical exam, and perform an electrocardiogram (ECG) to record the electrical activity of your heart. Your health care provider may also perform other heart or blood tests to determine the cause of your syncope which may include: °· Transthoracic echocardiogram (TTE). During echocardiography, sound waves are used to evaluate how blood flows through your heart. °· Transesophageal echocardiogram (TEE). °· Cardiac monitoring. This allows your health care provider to monitor your heart rate and rhythm in real time. °· Holter monitor. This is a portable device that records your  heartbeat and can help diagnose heart arrhythmias. It allows your health care provider to track your heart activity for several days, if needed. °· Stress tests by exercise or by giving medicine that makes the heart beat faster. °TREATMENT  °In most cases, no treatment is needed. Depending on the cause of your syncope, your health care provider may recommend changing or stopping some of your medicines. °HOME CARE INSTRUCTIONS °· Have someone stay with you until you feel stable. °· Do not drive, use machinery, or play sports until your health care provider says it is okay. °· Keep all follow-up appointments as directed by your health care provider. °· Lie down right away if you start feeling like you might faint. Breathe deeply and steadily. Wait until all the symptoms have passed. °· Drink enough fluids to keep your urine clear or pale yellow. °· If you are taking blood pressure or heart medicine, get up slowly and take several minutes to sit and then stand. This can reduce dizziness. °SEEK IMMEDIATE MEDICAL CARE IF:  °· You have a severe headache. °· You have unusual pain in the chest, abdomen, or back. °· You are bleeding from your mouth or rectum, or you have black or tarry stool. °· You have an irregular or very fast heartbeat. °· You have pain with breathing. °· You have repeated fainting or seizure-like jerking during an episode. °· You faint when sitting or lying down. °· You have confusion. °· You have trouble walking. °· You have severe weakness. °· You have vision problems. °If you fainted, call your local emergency services (911 in [place] Do not drive   yourself to the hospital.  °MAKE SURE YOU: °· Understand these instructions. °· Will watch your condition. °· Will get help right away if you are not doing well or get worse. °Document Released: 09/06/2005 Document Revised: 09/11/2013 Document Reviewed: 11/05/2011 °Exit[employer]® Patient Information ©2015 [employer]. This information is not intended to replace  advice given to you by your health care provider. Make sure you discuss any questions you have with your health care provider. ° °

## 2015-05-08 DIAGNOSIS — R040 Epistaxis: Secondary | ICD-10-CM

## 2015-05-08 HISTORY — DX: Epistaxis: R04.0

## 2015-10-22 HISTORY — PX: LOOP RECORDER IMPLANT: SHX5954

## 2015-11-14 ENCOUNTER — Encounter (HOSPITAL_COMMUNITY): Payer: Self-pay

## 2015-11-14 ENCOUNTER — Emergency Department (HOSPITAL_COMMUNITY)
Admission: EM | Admit: 2015-11-14 | Discharge: 2015-11-14 | Disposition: A | Discharge status: Home / Self Care | Payer: Medicare Other | Attending: Emergency Medicine | Admitting: Emergency Medicine

## 2015-11-14 DIAGNOSIS — Z95810 Presence of automatic (implantable) cardiac defibrillator: Secondary | ICD-10-CM | POA: Insufficient documentation

## 2015-11-14 DIAGNOSIS — Z8719 Personal history of other diseases of the digestive system: Secondary | ICD-10-CM | POA: Diagnosis not present

## 2015-11-14 DIAGNOSIS — R197 Diarrhea, unspecified: Secondary | ICD-10-CM | POA: Insufficient documentation

## 2015-11-14 DIAGNOSIS — I10 Essential (primary) hypertension: Secondary | ICD-10-CM | POA: Diagnosis not present

## 2015-11-14 DIAGNOSIS — Z941 Heart transplant status: Secondary | ICD-10-CM | POA: Diagnosis not present

## 2015-11-14 DIAGNOSIS — Z8669 Personal history of other diseases of the nervous system and sense organs: Secondary | ICD-10-CM | POA: Diagnosis not present

## 2015-11-14 DIAGNOSIS — R55 Syncope and collapse: Secondary | ICD-10-CM | POA: Insufficient documentation

## 2015-11-14 DIAGNOSIS — I509 Heart failure, unspecified: Secondary | ICD-10-CM | POA: Diagnosis not present

## 2015-11-14 DIAGNOSIS — Z7982 Long term (current) use of aspirin: Secondary | ICD-10-CM | POA: Insufficient documentation

## 2015-11-14 DIAGNOSIS — Z79899 Other long term (current) drug therapy: Secondary | ICD-10-CM | POA: Diagnosis not present

## 2015-11-14 DIAGNOSIS — R112 Nausea with vomiting, unspecified: Secondary | ICD-10-CM | POA: Insufficient documentation

## 2015-11-14 DIAGNOSIS — Z87448 Personal history of other diseases of urinary system: Secondary | ICD-10-CM | POA: Diagnosis not present

## 2015-11-14 HISTORY — DX: Disorder of kidney and ureter, unspecified: N28.9

## 2015-11-14 LAB — CBC
HEMATOCRIT: 40.7 % (ref 39.0–52.0)
Hemoglobin: 13 g/dL (ref 13.0–17.0)
MCH: 30 pg (ref 26.0–34.0)
MCHC: 31.9 g/dL (ref 30.0–36.0)
MCV: 94 fL (ref 78.0–100.0)
Platelets: 174 10*3/uL (ref 150–400)
RBC: 4.33 MIL/uL (ref 4.22–5.81)
RDW: 13.1 % (ref 11.5–15.5)
WBC: 8.8 10*3/uL (ref 4.0–10.5)

## 2015-11-14 LAB — COMPREHENSIVE METABOLIC PANEL
ALT: 32 U/L (ref 17–63)
AST: 30 U/L (ref 15–41)
Albumin: 4.1 g/dL (ref 3.5–5.0)
Alkaline Phosphatase: 55 U/L (ref 38–126)
Anion gap: 13 (ref 5–15)
BUN: 36 mg/dL — ABNORMAL HIGH (ref 6–20)
CO2: 25 mmol/L (ref 22–32)
Calcium: 9.3 mg/dL (ref 8.9–10.3)
Chloride: 106 mmol/L (ref 101–111)
Creatinine, Ser: 2.63 mg/dL — ABNORMAL HIGH (ref 0.61–1.24)
GFR calc Af Amer: 29 mL/min — ABNORMAL LOW (ref >60)
GFR, EST NON AFRICAN AMERICAN: 25 mL/min — AB (ref >60)
Glucose, Bld: 131 mg/dL — ABNORMAL HIGH (ref 65–99)
POTASSIUM: 4.6 mmol/L (ref 3.5–5.1)
SODIUM: 144 mmol/L (ref 135–145)
Total Bilirubin: 0.9 mg/dL (ref 0.3–1.2)
Total Protein: 7.8 g/dL (ref 6.5–8.1)

## 2015-11-14 LAB — LIPASE, BLOOD: LIPASE: 25 U/L (ref 11–51)

## 2015-11-14 MED ORDER — ONDANSETRON 4 MG PO TBDP
4.0000 mg | ORAL_TABLET | Freq: Once | ORAL | Status: AC | PRN
  Administered 2015-11-14: 4 mg via ORAL
  Filled 2015-11-14: qty 1

## 2015-11-14 MED ORDER — SODIUM CHLORIDE 0.9 % IV BOLUS (SEPSIS)
500.0000 mL | Freq: Once | INTRAVENOUS | Status: AC
  Administered 2015-11-14: 500 mL via INTRAVENOUS

## 2015-11-14 MED ORDER — ONDANSETRON 4 MG PO TBDP: 4.0000 mg | ORAL_TABLET | Freq: Three times a day (TID) | ORAL | Status: DC | PRN

## 2015-11-14 NOTE — ED Notes (Signed)
Bed: WA17 Expected date:  Expected time:  Means of arrival:  Comments: TR 2

## 2015-11-14 NOTE — ED Notes (Signed)
Pt with n/v/d since yesterday.  No fever.  Pt had recent loop recorder placed.  Heart transplant in 2005.  Recent flu shot.  Recent higher rejection numbers from his transplant and patient is adjusting meds.  Pt also has kidney disease from his meds.  Pt is still vomiting.

## 2015-11-14 NOTE — ED Notes (Signed)
Pt aware we need urine, urinal at bedside, will let us know when he is able to provide a specimen

## 2015-11-14 NOTE — ED Provider Notes (Signed)
CSN: [id number]     Arrival date & time 11/14/15  1114 History   First MD Initiated Contact with Patient 11/14/15 1315     Chief Complaint  Patient presents with  . Emesis     (Consider location/radiation/quality/duration/timing/severity/associated sxs/prior Treatment) HPI   Patient is a 60 year old male with past medical history of hypertension, renal disease (due to transplant medications) and s/p heart transplant (2005) who presents to the ED with vomiting and diarrhea, onset 10pm last night. Pt reports he had sudden onset of nausea, NBNB vomiting and nonbloody diarrhea, wife reports having 5 total episodes of both vomiting and diarrhea. Denies fever, chills, body aches, nasal congestion, sore throat, cough, SOB, chest pain, hematemesis, abdominal pain, urinary sxs, numbness, tingling, weakness, rash. Denies any recent sick contacts.   Pt reports he is followed by Dr. Deniece Portela at [facility] regarding his heart transplant. Wife reports the pt recently had a loop recorder placed a few weeks ago due to having multiple syncopal episodes. Wife reports the pt recently had higher "rejection numbers" and is having his medications adjusted. Wife and pt report that they are concerned about the pt's heart rejected due to his sxs of N/V/D and note that the were advised to watch out for these sxs as they may be a sign of rejection. Wife reports the pt's next appointment with Dr. Posey Pronto in on 11/17/15.   Past Medical History  Diagnosis Date  . Heart transplanted ([place])   . Hypertension   . CHF (congestive heart failure) ([place])     "before heart transplant in 2005  . History of stomach ulcers     "before heart transplant"  . Fahr's syndrome ([place])   . Headache(784.0)     "usually have one q other day; attributed to Fahr's syndrome" (09/25/2013)  . Migraine     "don't get them often anymore" (09/25/2013)  . Syncope and collapse ~ 2010; 2011; 07/2013; 09/25/2013  . Renal disorder     medication induced   Past  Surgical History  Procedure Laterality Date  . Heart transplant  04/2004  . Cardiac defibrillator placement  10/2001; 06/2003    "took 2nd one out before heart transplant in 04/2004"  . Cataract extraction w/ intraocular lens  implant, bilateral Bilateral ?2009   History reviewed. No pertinent family history. Social History  Substance Use Topics  . Smoking status: Never Smoker   . Smokeless tobacco: Never Used  . Alcohol Use: No    Review of Systems  Gastrointestinal: Positive for nausea, vomiting and diarrhea.  All other systems reviewed and are negative.     Allergies  Atrovent; Peanuts; Fluticasone; and Ace inhibitors  Home Medications   Prior to Admission medications   Medication Sig Start Date End Date Taking? Authorizing Provider  Ascorbic Acid (VITAMIN C PO) Take 1 tablet by mouth daily.   Yes Historical Provider, MD  aspirin EC 81 MG tablet Take 81 mg by mouth daily.   Yes Historical Provider, MD  carvedilol (COREG) 12.5 MG tablet Take 12.5 mg by mouth 2 (two) times daily. 09/18/13  Yes Historical Provider, MD  Cholecalciferol (VITAMIN D PO) Take 1 tablet by mouth daily.   Yes Historical Provider, MD  diphenoxylate-atropine (LOMOTIL) 2.5-0.025 MG per tablet Take 1 tablet by mouth 2 (two) times daily between meals as needed for diarrhea or loose stools. 09/26/13  Yes Leanna Battles, MD  HYDROcodone-acetaminophen (NORCO/VICODIN) 5-325 MG per tablet Take 1 tablet by mouth 3 (three) times daily as needed. For pain  07/27/13  Yes Historical Provider, MD  OVER THE COUNTER MEDICATION Take 1 tablet by mouth 3 (three) times daily. [facility] Life Essentials Multi-Vitamin   Yes Historical Provider, MD  pantoprazole (PROTONIX) 40 MG tablet Take 40 mg by mouth every other day.   Yes Historical Provider, MD  pravastatin (PRAVACHOL) 40 MG tablet Take 40 mg by mouth daily.    Yes Historical Provider, MD  tacrolimus (PROGRAF) 1 MG capsule Take 1-2 mg by mouth 2 (two) times daily. Take 2  capsules in AM and 1 capsule in PM 09/04/13  Yes Historical Provider, MD  EPINEPHrine 0.3 mg/0.3 mL IJ SOAJ injection Inject 0.3 mLs (0.3 mg total) into the muscle once. Patient not taking: Reported on 11/14/2015 07/12/14   Riki Altes, MD  ondansetron (ZOFRAN ODT) 4 MG disintegrating tablet Take 1 tablet (4 mg total) by mouth every 8 (eight) hours as needed for nausea or vomiting. 11/14/15   Nona Dell, PA-C   BP 125/84 mmHg  Pulse 87  Temp(Src) 98.2 F (36.8 C) (Oral)  Resp 19  SpO2 98% Physical Exam  Constitutional: He is oriented to person, place, and time. He appears well-developed and well-nourished. No distress.  HENT:  Head: Normocephalic and atraumatic.  Mouth/Throat: Oropharynx is clear and moist. No oropharyngeal exudate.  Eyes: Conjunctivae and EOM are normal. Pupils are equal, round, and reactive to light. Right eye exhibits no discharge. Left eye exhibits no discharge. No scleral icterus.  Neck: Normal range of motion. Neck supple.  Cardiovascular: Normal rate, regular rhythm, normal heart sounds and intact distal pulses.   Pulmonary/Chest: Effort normal and breath sounds normal. No respiratory distress. He has no wheezes. He has no rales. He exhibits no tenderness.  Abdominal: Soft. Bowel sounds are normal. He exhibits no distension and no mass. There is no tenderness. There is no rebound and no guarding.  Musculoskeletal: Normal range of motion. He exhibits no edema.  Lymphadenopathy:    He has no cervical adenopathy.  Neurological: He is alert and oriented to person, place, and time.  Skin: Skin is warm and dry. He is not diaphoretic.  Nursing note and vitals reviewed.   ED Course  Procedures (including critical care time) Labs Review Labs Reviewed  COMPREHENSIVE METABOLIC PANEL - Abnormal; Notable for the following:    Glucose, Bld 131 (*)    BUN 36 (*)    Creatinine, Ser 2.63 (*)    GFR calc non Af Amer 25 (*)    GFR calc Af Amer 29 (*)    All  other components within normal limits  LIPASE, BLOOD  CBC  URINALYSIS, ROUTINE W REFLEX MICROSCOPIC (NOT AT [facility])    Imaging Review No results found. I have personally reviewed and evaluated these images and lab results as part of my medical decision-making.   EKG Interpretation None      MDM   Final diagnoses:  Non-intractable vomiting with nausea, vomiting of unspecified type  Diarrhea, unspecified type    Pt presents with N/V/D, onset last night. Hx of heart transplant (2005), is followed by Dr. Posey Pronto at [place]. VSS. Exam unremarkable. BUN 36, Cr 2.63 which appear to be at pt's baseline levels. Remaining labs unremarkable.   Consulted Dr. Deniece Portela. Discussed pt presentation and labs with Dr. Posey Pronto. He reports that it appears that the pt's sxs are likely due to viral gastroenteritis. He recommended to advise the pt to return to the ED over the weekend if his sxs worsen, otherwise he notes  he will call the pt to schedule a follow up appointment for early next week.   Discussed results and plan with pt and wife. Will PO challenge. Pt able to tolerate PO. Plan to d/c pt home with antiemetics and symptomatic tx with plan to f/u with Dr. Posey Pronto on Monday.   Evaluation does not show pathology requring ongoing emergent intervention or admission. Pt is hemodynamically stable and mentating appropriately. Discussed findings/results and plan with patient/guardian, who agrees with plan. All questions answered. Return precautions discussed and outpatient follow up given.    Chesley Noon [place], [place] 11/14/15 [place], MD 11/14/15 [phone]

## 2015-11-14 NOTE — ED Notes (Signed)
Vital signs stable. 

## 2015-11-14 NOTE — Discharge Instructions (Signed)
Take your medications as prescribed as have for nausea/vomiting. Continue drinking fluids at home term and hydrated. Follow-up with Dr. Posey Pronto at your scheduled appointment on Monday. Return to the emergency department if symptoms worsen or new onset of fever, uncontrollable vomiting, vomiting blood, unable to keep fluids down, abdominal pain, shortness of breath, chest pain, swelling.

## 2015-12-03 DIAGNOSIS — I951 Orthostatic hypotension: Secondary | ICD-10-CM

## 2015-12-03 DIAGNOSIS — Z959 Presence of cardiac and vascular implant and graft, unspecified: Secondary | ICD-10-CM

## 2015-12-03 HISTORY — DX: Presence of cardiac and vascular implant and graft, unspecified: Z95.9

## 2015-12-03 HISTORY — DX: Orthostatic hypotension: I95.1

## 2016-03-29 ENCOUNTER — Emergency Department (HOSPITAL_COMMUNITY): Payer: Medicare Other

## 2016-03-29 ENCOUNTER — Emergency Department (HOSPITAL_COMMUNITY)
Admission: EM | Admit: 2016-03-29 | Discharge: 2016-03-29 | Disposition: A | Discharge status: Home / Self Care | Payer: Medicare Other | Attending: Emergency Medicine | Admitting: Emergency Medicine

## 2016-03-29 ENCOUNTER — Encounter (HOSPITAL_COMMUNITY): Payer: Self-pay | Admitting: Emergency Medicine

## 2016-03-29 DIAGNOSIS — Z91010 Allergy to peanuts: Secondary | ICD-10-CM | POA: Insufficient documentation

## 2016-03-29 DIAGNOSIS — Z95810 Presence of automatic (implantable) cardiac defibrillator: Secondary | ICD-10-CM | POA: Diagnosis not present

## 2016-03-29 DIAGNOSIS — I110 Hypertensive heart disease with heart failure: Secondary | ICD-10-CM | POA: Diagnosis not present

## 2016-03-29 DIAGNOSIS — R55 Syncope and collapse: Secondary | ICD-10-CM

## 2016-03-29 DIAGNOSIS — J189 Pneumonia, unspecified organism: Secondary | ICD-10-CM | POA: Insufficient documentation

## 2016-03-29 DIAGNOSIS — I509 Heart failure, unspecified: Secondary | ICD-10-CM | POA: Insufficient documentation

## 2016-03-29 DIAGNOSIS — Z7982 Long term (current) use of aspirin: Secondary | ICD-10-CM | POA: Diagnosis not present

## 2016-03-29 DIAGNOSIS — Z79899 Other long term (current) drug therapy: Secondary | ICD-10-CM | POA: Insufficient documentation

## 2016-03-29 LAB — URINALYSIS, ROUTINE W REFLEX MICROSCOPIC
BILIRUBIN URINE: NEGATIVE
Glucose, UA: NEGATIVE mg/dL
HGB URINE DIPSTICK: NEGATIVE
Ketones, ur: NEGATIVE mg/dL
Leukocytes, UA: NEGATIVE
Nitrite: NEGATIVE
Protein, ur: NEGATIVE mg/dL
SPECIFIC GRAVITY, URINE: 1.02 (ref 1.005–1.030)
pH: 5.5 (ref 5.0–8.0)

## 2016-03-29 LAB — COMPREHENSIVE METABOLIC PANEL
ALT: 23 U/L (ref 17–63)
AST: 23 U/L (ref 15–41)
Albumin: 3.4 g/dL — ABNORMAL LOW (ref 3.5–5.0)
Alkaline Phosphatase: 48 U/L (ref 38–126)
Anion gap: 7 (ref 5–15)
BUN: 29 mg/dL — ABNORMAL HIGH (ref 6–20)
CO2: 24 mmol/L (ref 22–32)
Calcium: 9.1 mg/dL (ref 8.9–10.3)
Chloride: 107 mmol/L (ref 101–111)
Creatinine, Ser: 2.06 mg/dL — ABNORMAL HIGH (ref 0.61–1.24)
GFR calc Af Amer: 38 mL/min — ABNORMAL LOW (ref >60)
GFR calc non Af Amer: 33 mL/min — ABNORMAL LOW (ref >60)
Glucose, Bld: 135 mg/dL — ABNORMAL HIGH (ref 65–99)
Potassium: 4.1 mmol/L (ref 3.5–5.1)
SODIUM: 138 mmol/L (ref 135–145)
Total Bilirubin: 1.4 mg/dL — ABNORMAL HIGH (ref 0.3–1.2)
Total Protein: 7.1 g/dL (ref 6.5–8.1)

## 2016-03-29 LAB — CBC WITH DIFFERENTIAL/PLATELET
Basophils Absolute: 0 10*3/uL (ref 0.0–0.1)
Basophils Relative: 0 %
EOS ABS: 0.5 10*3/uL (ref 0.0–0.7)
Eosinophils Relative: 5 %
HCT: 39.6 % (ref 39.0–52.0)
HEMOGLOBIN: 12.8 g/dL — AB (ref 13.0–17.0)
LYMPHS ABS: 1.1 10*3/uL (ref 0.7–4.0)
Lymphocytes Relative: 9 %
MCH: 29.9 pg (ref 26.0–34.0)
MCHC: 32.3 g/dL (ref 30.0–36.0)
MCV: 92.5 fL (ref 78.0–100.0)
Monocytes Absolute: 0.8 10*3/uL (ref 0.1–1.0)
Monocytes Relative: 7 %
NEUTROS PCT: 79 %
Neutro Abs: 9.2 10*3/uL — ABNORMAL HIGH (ref 1.7–7.7)
Platelets: 170 10*3/uL (ref 150–400)
RBC: 4.28 MIL/uL (ref 4.22–5.81)
RDW: 13.3 % (ref 11.5–15.5)
WBC: 11.6 10*3/uL — AB (ref 4.0–10.5)

## 2016-03-29 LAB — TROPONIN I: Troponin I: <0.03 ng/mL (ref <0.03)

## 2016-03-29 LAB — CBG MONITORING, ED: Glucose-Capillary: 140 mg/dL — ABNORMAL HIGH (ref 65–99)

## 2016-03-29 MED ORDER — HYDROCODONE-ACETAMINOPHEN 5-325 MG PO TABS
1.0000 | ORAL_TABLET | Freq: Once | ORAL | Status: AC
  Administered 2016-03-29: 1 via ORAL
  Filled 2016-03-29: qty 1

## 2016-03-29 MED ORDER — DOXYCYCLINE HYCLATE 100 MG PO TABS: 100.0000 mg | ORAL_TABLET | Freq: Two times a day (BID) | ORAL | Status: DC

## 2016-03-29 MED ORDER — CEPHALEXIN 250 MG PO CAPS
500.0000 mg | ORAL_CAPSULE | Freq: Once | ORAL | Status: AC
  Administered 2016-03-29: 500 mg via ORAL
  Filled 2016-03-29: qty 2

## 2016-03-29 MED ORDER — DOXYCYCLINE HYCLATE 100 MG PO TABS
100.0000 mg | ORAL_TABLET | Freq: Once | ORAL | Status: AC
  Administered 2016-03-29: 100 mg via ORAL
  Filled 2016-03-29: qty 1

## 2016-03-29 MED ORDER — CEPHALEXIN 500 MG PO CAPS: 500.0000 mg | ORAL_CAPSULE | Freq: Two times a day (BID) | ORAL | Status: DC

## 2016-03-29 MED ORDER — SODIUM CHLORIDE 0.9 % IV SOLN
INTRAVENOUS | Status: DC
  Administered 2016-03-29: 1000 mL via INTRAVENOUS

## 2016-03-29 NOTE — ED Notes (Signed)
Pt CBG, 140.

## 2016-03-29 NOTE — ED Notes (Addendum)
Pt with syncopal episode this AM on the bathroom floor. Denies neck/back pain. Pt awake, alert, oriented x4, VSS. Per pt has recently had more frequent episodes of syncope over the last 4-5 weeks. Hx of heart transplant. Pt with pale appearance. Denies recent fever. C/o some cough over the last week. CBG 157

## 2016-03-29 NOTE — Discharge Instructions (Signed)
Please be sure to follow-up with your heart transplant team tomorrow, and your primary care physician in one week.    Community-Acquired Pneumonia, Adult Pneumonia is an infection of the lungs. There are different types of pneumonia. One type can develop while a person is in a hospital. A different type, called community-acquired pneumonia, develops in people who are not, or have not recently been, in the hospital or other health care facility.  CAUSES Pneumonia may be caused by bacteria, viruses, or funguses. Community-acquired pneumonia is often caused by Streptococcus pneumonia bacteria. These bacteria are often passed from one person to another by breathing in droplets from the cough or sneeze of an infected person. RISK FACTORS The condition is more likely to develop in:  People who havechronic diseases, such as chronic obstructive pulmonary disease (COPD), asthma, congestive heart failure, cystic fibrosis, diabetes, or kidney disease.  People who haveearly-stage or late-stage HIV.  People who havesickle cell disease.  People who havehad their spleen removed (splenectomy).  People who havepoor Human resources officer.  People who havemedical conditions that increase the risk of breathing in (aspirating) secretions their own mouth and nose.   People who havea weakened immune system (immunocompromised).  People who smoke.  People whotravel to areas where pneumonia-causing germs commonly exist.  People whoare around animal habitats or animals that have pneumonia-causing germs, including birds, bats, rabbits, cats, and farm animals. SYMPTOMS Symptoms of this condition include:  Adry cough.  A wet (productive) cough.  Fever.  Sweating.  Chest pain, especially when breathing deeply or coughing.  Rapid breathing or difficulty breathing.  Shortness of breath.  Shaking chills.  Fatigue.  Muscle aches. DIAGNOSIS Your health care provider will take a medical history  and perform a physical exam. You may also have other tests, including:  Imaging studies of your chest, including X-rays.  Tests to check your blood oxygen level and other blood gases.  Other tests on blood, mucus (sputum), fluid around your lungs (pleural fluid), and urine. If your pneumonia is severe, other tests may be done to identify the specific cause of your illness. TREATMENT The type of treatment that you receive depends on many factors, such as the cause of your pneumonia, the medicines you take, and other medical conditions that you have. For most adults, treatment and recovery from pneumonia may occur at home. In some cases, treatment must happen in a hospital. Treatment may include:  Antibiotic medicines, if the pneumonia was caused by bacteria.  Antiviral medicines, if the pneumonia was caused by a virus.  Medicines that are given by mouth or through an IV tube.  Oxygen.  Respiratory therapy. Although rare, treating severe pneumonia may include:  Mechanical ventilation. This is done if you are not breathing well on your own and you cannot maintain a safe blood oxygen level.  Thoracentesis. This procedureremoves fluid around one lung or both lungs to help you breathe better. HOME CARE INSTRUCTIONS  Take over-the-counter and prescription medicines only as told by your health care provider.  Only takecough medicine if you are losing sleep. Understand that cough medicine can prevent your body's natural ability to remove mucus from your lungs.  If you were prescribed an antibiotic medicine, take it as told by your health care provider. Do not stop taking the antibiotic even if you start to feel better.  Sleep in a semi-upright position at night. Try sleeping in a reclining chair, or place a few pillows under your head.  Do not use tobacco products, including cigarettes,  chewing tobacco, and e-cigarettes. If you need help quitting, ask your health care provider.  Drink  enough water to keep your urine clear or pale yellow. This will help to thin out mucus secretions in your lungs. PREVENTION There are ways that you can decrease your risk of developing community-acquired pneumonia. Consider getting a pneumococcal vaccine if:  You are older than 61 years of age.  You are older than 62 years of age and are undergoing cancer treatment, have chronic lung disease, or have other medical conditions that affect your immune system. Ask your health care provider if this applies to you. There are different types and schedules of pneumococcal vaccines. Ask your health care provider which vaccination option is best for you. You may also prevent community-acquired pneumonia if you take these actions:  Get an influenza vaccine every year. Ask your health care provider which type of influenza vaccine is best for you.  Go to the dentist on a regular basis.  Wash your hands often. Use hand sanitizer if soap and water are not available. SEEK MEDICAL CARE IF:  You have a fever.  You are losing sleep because you cannot control your cough with cough medicine. SEEK IMMEDIATE MEDICAL CARE IF:  You have worsening shortness of breath.  You have increased chest pain.  Your sickness becomes worse, especially if you are an older adult or have a weakened immune system.  You cough up blood.   This information is not intended to replace advice given to you by your health care provider. Make sure you discuss any questions you have with your health care provider.   Document Released: 09/06/2005 Document Revised: 05/28/2015 Document Reviewed: 01/01/2015 Elsevier Interactive Patient Education [employer].

## 2016-03-29 NOTE — ED Provider Notes (Signed)
CSN: [id number]     Arrival date & time 03/29/16  0718 History   First MD Initiated Contact with Patient 03/29/16 0719     Chief Complaint  Patient presents with  . Loss of Consciousness    HPI  Patient presents after an episode of syncope. Patient has a notable history of heart transplant in 2005. He notes that over the past 2 months he has had multiple episodes of syncope. Typically these occur without prodrome, without pain. During this.  He has been evaluated by his heart transplant team, has had placement of a loop recorder. Interrogation of the loop recorder after one event did not demonstrate arrhythmia. Today he had a similar event, with no prodrome, sudden loss of consciousness, but no pain either before or afterwards. Currently he has no complaints. He denies other ongoing recent changes from baseline medical condition, including fever, cough, chills, nausea, vomiting. He does have ongoing mild weakness, gait unsteadiness, though this has been present for some time. Patient is using physical therapy for this.   Past Medical History  Diagnosis Date  . Heart transplanted ([place])   . Hypertension   . CHF (congestive heart failure) ([place])     "before heart transplant in 2005  . History of stomach ulcers     "before heart transplant"  . 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 Headache(784.0)     "usually have one q other day; attributed to Fahr's syndrome" (09/25/2013)  . Migraine     "don't get them often anymore" (09/25/2013)  . Syncope and collapse ~ 2010; 2011; 07/2013; 09/25/2013  . Renal disorder     medication induced   Past Surgical History  Procedure Laterality Date  . Heart transplant  04/2004  . Cardiac defibrillator placement  10/2001; 06/2003    "took 2nd one out before heart transplant in 04/2004"  . Cataract extraction w/ intraocular lens  implant, bilateral Bilateral ?2009   History reviewed. No pertinent family history. Social History  Substance Use Topics  . Smoking  status: Never Smoker   . Smokeless tobacco: Never Used  . Alcohol Use: No    Review of Systems  Constitutional:       Per HPI, otherwise negative  HENT:       Per HPI, otherwise negative  Respiratory:       Per HPI, otherwise negative  Cardiovascular:       Per HPI, otherwise negative  Gastrointestinal: Negative for vomiting.  Endocrine:       Negative aside from HPI  Genitourinary:       Neg aside from HPI   Musculoskeletal:       Per HPI, otherwise negative  Skin: Negative.   Allergic/Immunologic: Positive for immunocompromised state.  Neurological: Positive for syncope and weakness.      Allergies  Atrovent; Peanuts; Fluticasone; and Ace inhibitors  Home Medications   Prior to Admission medications   Medication Sig Start Date End Date Taking? Authorizing Provider  Ascorbic Acid (VITAMIN C PO) Take 1 tablet by mouth daily.   Yes Historical Provider, MD  aspirin EC 81 MG tablet Take 81 mg by mouth daily.   Yes Historical Provider, MD  carvedilol (COREG) 12.5 MG tablet Take 12.5 mg by mouth 2 (two) times daily. 09/18/13  Yes Historical Provider, MD  Cholecalciferol (VITAMIN D PO) Take 1 tablet by mouth daily.   Yes Historical Provider, MD  diphenoxylate-atropine (LOMOTIL) 2.5-0.025 MG per tablet Take 1 tablet by mouth 2 (two) times daily between meals  as needed for diarrhea or loose stools. 09/26/13  Yes Leanna Battles, MD  EPINEPHrine 0.3 mg/0.3 mL IJ SOAJ injection Inject 0.3 mLs (0.3 mg total) into the muscle once. 07/12/14  Yes Riki Altes, MD  HYDROcodone-acetaminophen (NORCO/VICODIN) 5-325 MG per tablet Take 1 tablet by mouth 3 (three) times daily as needed. For pain 07/27/13  Yes Historical Provider, MD  loratadine (CLARITIN) 10 MG tablet Take 10 mg by mouth daily.   Yes Historical Provider, MD  Multiple Vitamin (MULTIVITAMIN WITH MINERALS) TABS tablet Take 1 tablet by mouth daily.   Yes Historical Provider, MD  OVER THE COUNTER MEDICATION Take 1 tablet by mouth 3  (three) times daily. [facility] Multi-Vitamin   Yes Historical Provider, MD  pantoprazole (PROTONIX) 40 MG tablet Take 40 mg by mouth every other day.   Yes Historical Provider, MD  pravastatin (PRAVACHOL) 40 MG tablet Take 40 mg by mouth daily. Reported on 03/29/2016   Yes Historical Provider, MD  tacrolimus (PROGRAF) 1 MG capsule Take 1 mg by mouth 2 (two) times daily.  09/04/13  Yes Historical Provider, MD  triamcinolone (NASACORT ALLERGY 24HR) 55 MCG/ACT AERO nasal inhaler Place 2 sprays into the nose daily.   Yes Historical Provider, MD   BP 138/87 mmHg  Pulse 94  Temp(Src) 98.2 F (36.8 C) (Oral)  Resp 22  Ht 6\' 1"  (1.854 m)  Wt 160 lb (72.576 kg)  BMI 21.11 kg/m2  SpO2 97% Physical Exam  Constitutional: He is oriented to person, place, and time. He appears well-developed. No distress.  HENT:  Head: Normocephalic and atraumatic.  Eyes: Conjunctivae and EOM are normal.  Cardiovascular: Normal rate and regular rhythm.   Pulmonary/Chest: Effort normal. No stridor. No respiratory distress.  Abdominal: He exhibits no distension.  Musculoskeletal: He exhibits no edema.  Neurological: He is alert and oriented to person, place, and time. He displays atrophy. He displays no tremor. No cranial nerve deficit. He displays no seizure activity. Coordination normal.  Skin: Skin is warm and dry.  Psychiatric: He has a normal mood and affect.  Nursing note and vitals reviewed.   ED Course  Procedures (including critical care time) Labs Review Labs Reviewed  CBC WITH DIFFERENTIAL/PLATELET - Abnormal; Notable for the following:    WBC 11.6 (*)    Hemoglobin 12.8 (*)    Neutro Abs 9.2 (*)    All other components within normal limits  COMPREHENSIVE METABOLIC PANEL - Abnormal; Notable for the following:    Glucose, Bld 135 (*)    BUN 29 (*)    Creatinine, Ser 2.06 (*)    Albumin 3.4 (*)    Total Bilirubin 1.4 (*)    GFR calc non Af Amer 33 (*)    GFR calc Af Amer 38  (*)    All other components within normal limits  CBG MONITORING, ED - Abnormal; Notable for the following:    Glucose-Capillary 140 (*)    All other components within normal limits  TROPONIN I  URINALYSIS, ROUTINE W REFLEX MICROSCOPIC (NOT AT [facility])  TROPONIN I  POCT CBG (FASTING - GLUCOSE)-MANUAL ENTRY    Imaging Review Dg Chest 2 View  03/29/2016  CLINICAL DATA:  Syncope EXAM: CHEST  2 VIEW COMPARISON:  December 14, 2014 FINDINGS: Pacemaker wires are attached to the right heart. Heart size and pulmonary vascularity are normal. There is a loop recorder anteriorly on the left. No edema or consolidation. No bone lesions. There is mild degenerative change in the lower  thoracic spine. IMPRESSION: Pacemaker wires remain attached to the right heart. Heart size within normal limits. No edema or consolidation. Loop recorder present. Electronically Signed   By: Lowella Grip III M.D.   On: 03/29/2016 08:02     After the initial evaluation, return of x-ray, on repeat exam the patient states that he has some pain in the left lower chest wall, worse with bilateral shoulder flexion.  Patient's wife states that he has had some cough now for 7 days, patient earlier denied this.     Ct Chest Wo Contrast  03/29/2016  CLINICAL DATA:  Left rib pain after returning from x-ray EXAM: CT CHEST WITHOUT CONTRAST TECHNIQUE: Multidetector CT imaging of the chest was performed following the standard protocol without IV contrast. COMPARISON:  Chest x-ray 03/29/2016 FINDINGS: Cardiovascular: Again noted pacing wires attached to right anterior heart border and right mediastinum. Mediastinum/Nodes: There is a left vocal cord metallic implant in place. Mild thickening of upper esophageal wall axial image 16. Clinical correlation is necessary to exclude gastroesophageal reflux. Mild atherosclerotic calcifications of thoracic aorta. Ascending aorta measures 3.4 cm in diameter. Descending aorta measures 2.5 cm in diameter. A  precarinal lymph node measures 1.1 cm in diameter borderline by size criteria. There is no hilar adenopathy. Lungs/Pleura: There is peribronchial thickening mucous plugging and peribronchial consolidation in right lower lobe posteriorly please see axial image 128. This is highly suspicious for bronchopneumonia. Subtle patch infiltrate/ pneumonia noted also in left lower lobe laterally please see axial image 138. No pulmonary edema. No pleural or pericardial effusion. There is 5 mm nodule in right middle lobe. Upper Abdomen: The visualized upper abdomen shows no adrenal gland mass. No calcified gallstones are noted within visualize gallbladder. Musculoskeletal: No destructive bony lesions are noted. The patient is status post median sternotomy. Degenerative changes are noted thoracic spine. A loop recording device is noted in left anterior chest wall axial image 79. No rib fracture is noted. IMPRESSION: 1. There is peribronchial thickening mucous bronchial plugging and small peribronchial consolidation in right lower lobe posteriorly please see axial image 128. This is highly suspicious for bronchopneumonia. Subtle patch infiltrate/ pneumonia noted also in left lower lobe laterally please see axial image 138. Follow-up to resolution after treatment is recommended. 2. No mediastinal hematoma or adenopathy. 3. Status post median sternotomy. 4. Borderline precarinal lymph node probable reactive. 5. There is 5 mm nodule in right middle lobe. Please see axial image 88 No follow-up needed if patient is low-risk. Non-contrast chest CT can be considered in 12 months if patient is high-risk. This recommendation follows the consensus statement: Guidelines for Management of Incidental Pulmonary Nodules Detected on CT Images:From the Fleischner Society 2017; published online before print (10.1148/radiol.SG:5268862). 6. Mild thickening of upper esophageal wall. Gastroesophageal reflux disease cannot be excluded. Clinical  correlation is necessary. Electronically Signed   By: Lahoma Crocker M.D.   On: 03/29/2016 13:37   I have personally reviewed and evaluated these images and lab results as part of my medical decision-making.   EKG Interpretation   Date/Time:  Monday March 29 2016 07:23:09 EDT Ventricular Rate:  100 PR Interval:    QRS Duration: 89 QT Interval:  328 QTC Calculation: 423 R Axis:   25 Text Interpretation:  Sinus tachycardia Borderline T wave abnormalities  Abnormal ekg Confirmed by Carmin Muskrat  MD ([id number]) on 03/29/2016 8:04:57  AM       EMS rhythm strip w SR / SA, 96, twa ,abn  I discussed this case  with his transplant surgery team at [facility]. We discussed the findings, patient will follow-up at 9 AM tomorrow morning.   Update: Patient in no distress, he and his wife are aware of all findings, I discussed the radiology studies with our radiologist, then with the patient. With concern for pneumonia, discussed this case with pharmacy to ensure appropriate drugs, with no interaction given the patient's immunocompromised state.  MDM  Patient with a history of heart transplant presents after an episode of syncope. Patient has had multiple prior similar episodes, and today has essentially no symptoms in the emergency department however, the patient does have mild left lower chest wall discomfort, and eventually acknowledges cough for 7 days. After reassuring x-ray, follow-up CT does demonstrate pneumonia. Patient had no evidence for bacteremia or sepsis, no evidence for sustained arrhythmia, interrogation of his loop recorder did not demonstrate arrhythmia. Patient discharged in stable condition to follow-up with his heart transplant team tomorrow morning.  Carmin Muskrat, MD 03/29/16 [phone]

## 2016-03-29 NOTE — ED Notes (Signed)
Pt began c/o left rib pain after returning from xray. Dr. Vanita Panda notified. NNO.

## 2017-04-08 ENCOUNTER — Emergency Department (HOSPITAL_COMMUNITY)
Admission: EM | Admit: 2017-04-08 | Discharge: 2017-04-08 | Disposition: A | Discharge status: Home / Self Care | Payer: Medicare Other | Attending: Emergency Medicine | Admitting: Emergency Medicine

## 2017-04-08 ENCOUNTER — Emergency Department (HOSPITAL_COMMUNITY): Payer: Medicare Other

## 2017-04-08 ENCOUNTER — Encounter (HOSPITAL_COMMUNITY): Payer: Self-pay | Admitting: *Deleted

## 2017-04-08 DIAGNOSIS — Y9389 Activity, other specified: Secondary | ICD-10-CM | POA: Insufficient documentation

## 2017-04-08 DIAGNOSIS — R638 Other symptoms and signs concerning food and fluid intake: Secondary | ICD-10-CM | POA: Insufficient documentation

## 2017-04-08 DIAGNOSIS — Y998 Other external cause status: Secondary | ICD-10-CM | POA: Insufficient documentation

## 2017-04-08 DIAGNOSIS — I10 Essential (primary) hypertension: Secondary | ICD-10-CM

## 2017-04-08 DIAGNOSIS — W19XXXA Unspecified fall, initial encounter: Secondary | ICD-10-CM

## 2017-04-08 DIAGNOSIS — Y92002 Bathroom of unspecified non-institutional (private) residence single-family (private) house as the place of occurrence of the external cause: Secondary | ICD-10-CM | POA: Insufficient documentation

## 2017-04-08 DIAGNOSIS — I129 Hypertensive chronic kidney disease with stage 1 through stage 4 chronic kidney disease, or unspecified chronic kidney disease: Secondary | ICD-10-CM | POA: Diagnosis not present

## 2017-04-08 DIAGNOSIS — W010XXA Fall on same level from slipping, tripping and stumbling without subsequent striking against object, initial encounter: Secondary | ICD-10-CM | POA: Insufficient documentation

## 2017-04-08 DIAGNOSIS — N183 Chronic kidney disease, stage 3 (moderate): Secondary | ICD-10-CM | POA: Diagnosis not present

## 2017-04-08 DIAGNOSIS — Z79899 Other long term (current) drug therapy: Secondary | ICD-10-CM | POA: Insufficient documentation

## 2017-04-08 DIAGNOSIS — R51 Headache: Secondary | ICD-10-CM | POA: Insufficient documentation

## 2017-04-08 DIAGNOSIS — Z7982 Long term (current) use of aspirin: Secondary | ICD-10-CM | POA: Diagnosis not present

## 2017-04-08 DIAGNOSIS — Z7902 Long term (current) use of antithrombotics/antiplatelets: Secondary | ICD-10-CM | POA: Diagnosis not present

## 2017-04-08 DIAGNOSIS — R630 Anorexia: Secondary | ICD-10-CM

## 2017-04-08 LAB — CBC
HCT: 37.3 % — ABNORMAL LOW (ref 39.0–52.0)
HEMOGLOBIN: 12.1 g/dL — AB (ref 13.0–17.0)
MCH: 30.3 pg (ref 26.0–34.0)
MCHC: 32.4 g/dL (ref 30.0–36.0)
MCV: 93.3 fL (ref 78.0–100.0)
PLATELETS: 185 10*3/uL (ref 150–400)
RBC: 4 MIL/uL — AB (ref 4.22–5.81)
RDW: 13.5 % (ref 11.5–15.5)
WBC: 8.7 10*3/uL (ref 4.0–10.5)

## 2017-04-08 LAB — URINALYSIS, ROUTINE W REFLEX MICROSCOPIC
BILIRUBIN URINE: NEGATIVE
Bacteria, UA: NONE SEEN
GLUCOSE, UA: NEGATIVE mg/dL
HGB URINE DIPSTICK: NEGATIVE
KETONES UR: NEGATIVE mg/dL
LEUKOCYTES UA: NEGATIVE
Nitrite: NEGATIVE
PROTEIN: 100 mg/dL — AB
Specific Gravity, Urine: 1.014 (ref 1.005–1.030)
Squamous Epithelial / LPF: NONE SEEN
pH: 6 (ref 5.0–8.0)

## 2017-04-08 LAB — BASIC METABOLIC PANEL
Anion gap: 8 (ref 5–15)
BUN: 32 mg/dL — ABNORMAL HIGH (ref 6–20)
CALCIUM: 9.6 mg/dL (ref 8.9–10.3)
CHLORIDE: 105 mmol/L (ref 101–111)
CO2: 27 mmol/L (ref 22–32)
Creatinine, Ser: 1.92 mg/dL — ABNORMAL HIGH (ref 0.61–1.24)
GFR calc non Af Amer: 35 mL/min — ABNORMAL LOW (ref >60)
GFR, EST AFRICAN AMERICAN: 41 mL/min — AB (ref >60)
Glucose, Bld: 142 mg/dL — ABNORMAL HIGH (ref 65–99)
Potassium: 3.7 mmol/L (ref 3.5–5.1)
SODIUM: 140 mmol/L (ref 135–145)

## 2017-04-08 LAB — I-STAT TROPONIN, ED: TROPONIN I, POC: 0 ng/mL (ref 0.00–0.08)

## 2017-04-08 NOTE — ED Notes (Signed)
Patient transported to CT 

## 2017-04-08 NOTE — ED Notes (Signed)
ED Provider at bedside. 

## 2017-04-08 NOTE — ED Triage Notes (Signed)
Family remember reports pt had an unwitnessed fall this am. Pt denies hitting his head, no loc. Pt denies any pain. Family reports onset of generalized fatigue, weakness and slurred speech last night. Lack of appetite today. No acute distress is noted at triage.

## 2017-04-08 NOTE — Discharge Instructions (Signed)
It was our pleasure to provide your ER care today - we hope that you feel better.  Rest. Drink adequate fluids.  Consider supplementing nutrition with Ensure, Boost, or other nutritious shake.   Follow up with your primary care doctor in the coming week - also have your blood pressure rechecked then.   Return to ER if worse, new symptoms, fevers, trouble breathing, persistent vomiting, new or severe pain, other concern.

## 2017-04-08 NOTE — ED Provider Notes (Signed)
[place] DEPT Provider Note   CSN: [id number] Arrival date & time: 04/08/17  1505     History   Chief Complaint Chief Complaint  Patient presents with  . Fall  . Weakness    HPI Darin Vega is a 61 y.o. male.  Patient with recent poor appetite and had fall this AM.  Pt indicates he slipped on bath mat this AM.  Denies faintness or dizziness. Landed on buttocks. No loc. Ambulatory since. Spouse indicates acting normally since fall.  She does note in past 1-2 weeks, generally poor appetite. No vomiting. No diarrhea. Having normal bms. No abd pain. Making normal amt urine, no gu c/o. No fever or chills. Denies any chest pain or discomfort. Had had headaches, hx headache. No recent change in meds. States bp has been high, was put on new med 2 months ago, but has stayed high.    The history is provided by the patient.  Fall  Associated symptoms include headaches. Pertinent negatives include no chest pain, no abdominal pain and no shortness of breath.  Weakness  Associated symptoms include headaches. Pertinent negatives include no shortness of breath, no chest pain, no vomiting and no confusion.    Past Medical History:  Diagnosis Date  . CHF (congestive heart failure) ([place])    "before heart transplant in 2005  . 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 Headache(784.0)    "usually have one q other day; attributed to Fahr's syndrome" (09/25/2013)  . Heart transplanted ([place])   . History of stomach ulcers    "before heart transplant"  . Hypertension   . Migraine    "don't get them often anymore" (09/25/2013)  . Renal disorder    medication induced  . Syncope and collapse ~ 2010; 2011; 07/2013; 09/25/2013    Patient Active Problem List   Diagnosis Date Noted  . Diarrhea 09/26/2013    Class: Chronic  . Syncope 09/25/2013  . CKD (chronic kidney disease) stage 3, GFR 30-59 ml/min 09/25/2013  . History of heart transplant ([place]) 09/25/2013  . 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 Heart  transplanted [facility])     Past Surgical History:  Procedure Laterality Date  . CARDIAC DEFIBRILLATOR PLACEMENT  10/2001; 06/2003   "took 2nd one out before heart transplant in 04/2004"  . CATARACT EXTRACTION W/ INTRAOCULAR LENS  IMPLANT, BILATERAL Bilateral ?2009  . HEART TRANSPLANT  04/2004       Home Medications    Prior to Admission medications   Medication Sig Start Date End Date Taking? Authorizing Provider  Ascorbic Acid (VITAMIN C PO) Take 1 tablet by mouth daily.    [provider]  aspirin EC 81 MG tablet Take 81 mg by mouth daily.    [provider]  carvedilol (COREG) 12.5 MG tablet Take 12.5 mg by mouth 2 (two) times daily. 09/18/13   [provider]  cephALEXin (KEFLEX) 500 MG capsule Take 1 capsule (500 mg total) by mouth 2 (two) times daily. 03/29/16   Carmin Muskrat, MD  Cholecalciferol (VITAMIN D PO) Take 1 tablet by mouth daily.    [provider]  diphenoxylate-atropine (LOMOTIL) 2.5-0.025 MG per tablet Take 1 tablet by mouth 2 (two) times daily between meals as needed for diarrhea or loose stools. 09/26/13   Leanna Battles, MD  doxycycline (VIBRA-TABS) 100 MG tablet Take 1 tablet (100 mg total) by mouth 2 (two) times daily. 03/29/16   Carmin Muskrat, MD  EPINEPHrine 0.3 mg/0.3 mL IJ SOAJ injection Inject 0.3 mLs (  0.3 mg total) into the muscle once. 07/12/14   Riki Altes, MD  HYDROcodone-acetaminophen (NORCO/VICODIN) 5-325 MG per tablet Take 1 tablet by mouth 3 (three) times daily as needed. For pain 07/27/13   [provider]  loratadine (CLARITIN) 10 MG tablet Take 10 mg by mouth daily.    [provider]  Multiple Vitamin (MULTIVITAMIN WITH MINERALS) TABS tablet Take 1 tablet by mouth daily.    [provider]  OVER THE COUNTER MEDICATION Take 1 tablet by mouth 3 (three) times daily. [facility]entials Multi-Vitamin    [provider]  pantoprazole (PROTONIX) 40 MG tablet Take 40 mg  by mouth every other day.    [provider]  pravastatin (PRAVACHOL) 40 MG tablet Take 40 mg by mouth daily. Reported on 03/29/2016    [provider]  tacrolimus (PROGRAF) 1 MG capsule Take 1 mg by mouth 2 (two) times daily.  09/04/13   [provider]  triamcinolone (NASACORT ALLERGY 24HR) 55 MCG/ACT AERO nasal inhaler Place 2 sprays into the nose daily.    [provider]    Family History History reviewed. No pertinent family history.  Social History Social History  Substance Use Topics  . Smoking status: Never Smoker  . Smokeless tobacco: Never Used  . Alcohol use No     Allergies   Atrovent [ipratropium]; Peanuts [peanut oil]; Fluticasone; and Ace inhibitors   Review of Systems Review of Systems  Constitutional: Negative for fever.  HENT: Negative for sore throat.   Eyes: Negative for visual disturbance.  Respiratory: Negative for cough and shortness of breath.   Cardiovascular: Negative for chest pain and leg swelling.  Gastrointestinal: Negative for abdominal pain and vomiting.  Endocrine: Negative for polyuria.  Genitourinary: Negative for flank pain.  Musculoskeletal: Negative for back pain and neck pain.  Skin: Negative for rash and wound.  Neurological: Positive for weakness and headaches.  Hematological: Does not bruise/bleed easily.  Psychiatric/Behavioral: Negative for confusion.     Physical Exam Updated Vital Signs BP (!) 170/109   Pulse 99   Temp 98.7 F (37.1 C) (Oral)   Resp 16   SpO2 99%   Physical Exam  Constitutional: He appears well-developed and well-nourished. No distress.  HENT:  Head: Atraumatic.  Mouth/Throat: Oropharynx is clear and moist.  Eyes: Pupils are equal, round, and reactive to light. Conjunctivae are normal.  Neck: Neck supple. No tracheal deviation present.  Cardiovascular: Normal rate, regular rhythm, normal heart sounds and intact distal pulses.   Pulmonary/Chest: Effort normal and  breath sounds normal. No accessory muscle usage. No respiratory distress.  Abdominal: Soft. Bowel sounds are normal. He exhibits no distension. There is no tenderness.  Genitourinary:  Genitourinary Comments: No cva tenderness  Musculoskeletal: He exhibits no edema or tenderness.  CTLS spine, non tender, aligned, no step off. No focal extremity pain/tenderness.   Neurological: He is alert.  Speech clear/fluent. Ambulates w steady gait.   Skin: Skin is warm and dry.  Psychiatric: He has a normal mood and affect.  Nursing note and vitals reviewed.    ED Treatments / Results  Labs (all labs ordered are listed, but only abnormal results are displayed)   Results for orders placed or performed during the hospital encounter of 95/18/84  Basic metabolic panel  Result Value Ref Range   Sodium 140 135 - 145 mmol/L   Potassium 3.7 3.5 - 5.1 mmol/L   Chloride 105 101 - 111 mmol/L   CO2 27  22 - 32 mmol/L   Glucose, Bld 142 (H) 65 - 99 mg/dL   BUN 32 (H) 6 - 20 mg/dL   Creatinine, Ser 1.92 (H) 0.61 - 1.24 mg/dL   Calcium 9.6 8.9 - 10.3 mg/dL   GFR calc non Af Amer 35 (L) >60 mL/min   GFR calc Af Amer 41 (L) >60 mL/min   Anion gap 8 5 - 15  CBC  Result Value Ref Range   WBC 8.7 4.0 - 10.5 K/uL   RBC 4.00 (L) 4.22 - 5.81 MIL/uL   Hemoglobin 12.1 (L) 13.0 - 17.0 g/dL   HCT 37.3 (L) 39.0 - 52.0 %   MCV 93.3 78.0 - 100.0 fL   MCH 30.3 26.0 - 34.0 pg   MCHC 32.4 30.0 - 36.0 g/dL   RDW 13.5 11.5 - 15.5 %   Platelets 185 150 - 400 K/uL  Urinalysis, Routine w reflex microscopic  Result Value Ref Range   Color, Urine YELLOW YELLOW   APPearance HAZY (A) CLEAR   Specific Gravity, Urine 1.014 1.005 - 1.030   pH 6.0 5.0 - 8.0   Glucose, UA NEGATIVE NEGATIVE mg/dL   Hgb urine dipstick NEGATIVE NEGATIVE   Bilirubin Urine NEGATIVE NEGATIVE   Ketones, ur NEGATIVE NEGATIVE mg/dL   Protein, ur 100 (A) NEGATIVE mg/dL   Nitrite NEGATIVE NEGATIVE   Leukocytes, UA NEGATIVE NEGATIVE   RBC / HPF  6-30 0 - 5 RBC/hpf   WBC, UA 0-5 0 - 5 WBC/hpf   Bacteria, UA NONE SEEN NONE SEEN   Squamous Epithelial / LPF NONE SEEN NONE SEEN  I-Stat Troponin, ED (not at [facility])  Result Value Ref Range   Troponin i, poc 0.00 0.00 - 0.08 ng/mL   Comment 3           Ct Head Wo Contrast  Result Date: 04/08/2017 CLINICAL DATA:  Patient tripped and fell hitting head. Headache. History of Fahr's disease. EXAM: CT HEAD WITHOUT CONTRAST TECHNIQUE: Contiguous axial images were obtained from the base of the skull through the vertex without intravenous contrast. COMPARISON:  CT studies dating back through 02/08/2014 FINDINGS: Brain: Extensive intraparenchymal calcifications throughout the cerebral white matter, basal ganglia and thalami in addition to the brainstem and cerebellum consistent with history of Fahr's disease. No acute intracranial hemorrhage, midline shift or edema. No large vascular territory infarct or hydrocephalus. Fourth ventricle is midline. Vascular: No hyperdense vessel. Skull: No acute calvarial fracture nor lesion. Sinuses/Orbits: Bilateral lens replacements. Intact orbits and globes. Clear mastoids. Minimal ethmoid sinus mucosal thickening. Other: None IMPRESSION: 1. Chronic stable intraparenchymal calcifications consistent with history of Fahr's disease. 2. No acute intracranial abnormality. 3. Minimal ethmoid sinus chronic mucosal. Electronically Signed   By: Ashley Royalty M.D.   On: 04/08/2017 22:24     EKG  EKG Interpretation  Date/Time:  Friday April 08 2017 15:29:33 EDT Ventricular Rate:  110 PR Interval:  144 QRS Duration: 80 QT Interval:  334 QTC Calculation: 452 R Axis:   23 Text Interpretation:  Sinus tachycardia Nonspecific T wave abnormality Confirmed by Lajean Saver [phone]) on 04/08/2017 7:32:41 PM       Radiology Ct Head Wo Contrast  Result Date: 04/08/2017 CLINICAL DATA:  Patient tripped and fell hitting head. Headache. History of Fahr's disease. EXAM: CT HEAD WITHOUT  CONTRAST TECHNIQUE: Contiguous axial images were obtained from the base of the skull through the vertex without intravenous contrast. COMPARISON:  CT studies dating back through 02/08/2014 FINDINGS: Brain: Extensive intraparenchymal calcifications throughout  the cerebral white matter, basal ganglia and thalami in addition to the brainstem and cerebellum consistent with history of Fahr's disease. No acute intracranial hemorrhage, midline shift or edema. No large vascular territory infarct or hydrocephalus. Fourth ventricle is midline. Vascular: No hyperdense vessel. Skull: No acute calvarial fracture nor lesion. Sinuses/Orbits: Bilateral lens replacements. Intact orbits and globes. Clear mastoids. Minimal ethmoid sinus mucosal thickening. Other: None IMPRESSION: 1. Chronic stable intraparenchymal calcifications consistent with history of Fahr's disease. 2. No acute intracranial abnormality. 3. Minimal ethmoid sinus chronic mucosal. Electronically Signed   By: Ashley Royalty M.D.   On: 04/08/2017 22:24    Procedures Procedures (including critical care time)  Medications Ordered in ED Medications - No data to display   Initial Impression / Assessment and Plan / ED Course  I have reviewed the triage vital signs and the nursing notes.  Pertinent labs & imaging results that were available during my care of the patient were reviewed by me and considered in my medical decision making (see chart for details).  Labs.  Po fluids.  Imaging.  Reviewed nursing notes and prior charts for additional history.   Labs c/w baseline. Ct head neg.  Pt states feels fine currently.  Pt currently appears stable for d/c.  rec close pcp f/u re recheck, also will f/u recheck bp.  Return precautions provided.    Final Clinical Impressions(s) / ED Diagnoses   Final diagnoses:  None    New Prescriptions New Prescriptions   No medications on file     Lajean Saver, MD 04/08/17 2235

## 2017-09-25 ENCOUNTER — Inpatient Hospital Stay (HOSPITAL_COMMUNITY)
Admission: EM | Admit: 2017-09-25 | Discharge: 2017-10-01 | DRG: 682 | Disposition: A | Discharge status: Skilled Nursing Facility | Payer: Medicare Other | Attending: Family Medicine | Admitting: Family Medicine

## 2017-09-25 ENCOUNTER — Emergency Department (HOSPITAL_COMMUNITY): Payer: Medicare Other

## 2017-09-25 ENCOUNTER — Encounter (HOSPITAL_COMMUNITY): Payer: Self-pay | Admitting: *Deleted

## 2017-09-25 DIAGNOSIS — M79652 Pain in left thigh: Secondary | ICD-10-CM | POA: Diagnosis not present

## 2017-09-25 DIAGNOSIS — K219 Gastro-esophageal reflux disease without esophagitis: Secondary | ICD-10-CM | POA: Diagnosis present

## 2017-09-25 DIAGNOSIS — N200 Calculus of kidney: Secondary | ICD-10-CM | POA: Diagnosis present

## 2017-09-25 DIAGNOSIS — G9340 Encephalopathy, unspecified: Secondary | ICD-10-CM | POA: Diagnosis not present

## 2017-09-25 DIAGNOSIS — Z7982 Long term (current) use of aspirin: Secondary | ICD-10-CM

## 2017-09-25 DIAGNOSIS — Z888 Allergy status to other drugs, medicaments and biological substances status: Secondary | ICD-10-CM

## 2017-09-25 DIAGNOSIS — J9601 Acute respiratory failure with hypoxia: Secondary | ICD-10-CM | POA: Diagnosis present

## 2017-09-25 DIAGNOSIS — R680 Hypothermia, not associated with low environmental temperature: Secondary | ICD-10-CM | POA: Diagnosis present

## 2017-09-25 DIAGNOSIS — R471 Dysarthria and anarthria: Secondary | ICD-10-CM | POA: Diagnosis present

## 2017-09-25 DIAGNOSIS — Z961 Presence of intraocular lens: Secondary | ICD-10-CM | POA: Diagnosis present

## 2017-09-25 DIAGNOSIS — I10 Essential (primary) hypertension: Secondary | ICD-10-CM | POA: Diagnosis present

## 2017-09-25 DIAGNOSIS — N183 Chronic kidney disease, stage 3 unspecified: Secondary | ICD-10-CM | POA: Diagnosis present

## 2017-09-25 DIAGNOSIS — G35 Multiple sclerosis: Secondary | ICD-10-CM | POA: Diagnosis present

## 2017-09-25 DIAGNOSIS — G8191 Hemiplegia, unspecified affecting right dominant side: Secondary | ICD-10-CM | POA: Diagnosis present

## 2017-09-25 DIAGNOSIS — R000 Tachycardia, unspecified: Secondary | ICD-10-CM | POA: Diagnosis present

## 2017-09-25 DIAGNOSIS — R531 Weakness: Secondary | ICD-10-CM

## 2017-09-25 DIAGNOSIS — R197 Diarrhea, unspecified: Secondary | ICD-10-CM | POA: Diagnosis present

## 2017-09-25 DIAGNOSIS — E860 Dehydration: Secondary | ICD-10-CM

## 2017-09-25 DIAGNOSIS — M79609 Pain in unspecified limb: Secondary | ICD-10-CM | POA: Diagnosis not present

## 2017-09-25 DIAGNOSIS — Z91010 Allergy to peanuts: Secondary | ICD-10-CM

## 2017-09-25 DIAGNOSIS — I959 Hypotension, unspecified: Secondary | ICD-10-CM | POA: Diagnosis present

## 2017-09-25 DIAGNOSIS — R55 Syncope and collapse: Secondary | ICD-10-CM

## 2017-09-25 DIAGNOSIS — T68XXXA Hypothermia, initial encounter: Secondary | ICD-10-CM | POA: Diagnosis present

## 2017-09-25 DIAGNOSIS — I129 Hypertensive chronic kidney disease with stage 1 through stage 4 chronic kidney disease, or unspecified chronic kidney disease: Secondary | ICD-10-CM | POA: Diagnosis present

## 2017-09-25 DIAGNOSIS — Z941 Heart transplant status: Secondary | ICD-10-CM | POA: Diagnosis not present

## 2017-09-25 DIAGNOSIS — D649 Anemia, unspecified: Secondary | ICD-10-CM | POA: Diagnosis present

## 2017-09-25 DIAGNOSIS — R270 Ataxia, unspecified: Secondary | ICD-10-CM | POA: Diagnosis present

## 2017-09-25 DIAGNOSIS — Z515 Encounter for palliative care: Secondary | ICD-10-CM

## 2017-09-25 DIAGNOSIS — Z7951 Long term (current) use of inhaled steroids: Secondary | ICD-10-CM

## 2017-09-25 DIAGNOSIS — E876 Hypokalemia: Secondary | ICD-10-CM | POA: Diagnosis present

## 2017-09-25 DIAGNOSIS — R1110 Vomiting, unspecified: Secondary | ICD-10-CM

## 2017-09-25 DIAGNOSIS — N179 Acute kidney failure, unspecified: Principal | ICD-10-CM

## 2017-09-25 DIAGNOSIS — R627 Adult failure to thrive: Secondary | ICD-10-CM | POA: Diagnosis present

## 2017-09-25 DIAGNOSIS — E785 Hyperlipidemia, unspecified: Secondary | ICD-10-CM | POA: Diagnosis present

## 2017-09-25 DIAGNOSIS — R6510 Systemic inflammatory response syndrome (SIRS) of non-infectious origin without acute organ dysfunction: Secondary | ICD-10-CM | POA: Diagnosis present

## 2017-09-25 DIAGNOSIS — G238 Other specified degenerative diseases of basal ganglia: Secondary | ICD-10-CM | POA: Diagnosis not present

## 2017-09-25 DIAGNOSIS — Z66 Do not resuscitate: Secondary | ICD-10-CM | POA: Diagnosis not present

## 2017-09-25 DIAGNOSIS — Z79899 Other long term (current) drug therapy: Secondary | ICD-10-CM

## 2017-09-25 DIAGNOSIS — J9600 Acute respiratory failure, unspecified whether with hypoxia or hypercapnia: Secondary | ICD-10-CM | POA: Diagnosis present

## 2017-09-25 DIAGNOSIS — R7989 Other specified abnormal findings of blood chemistry: Secondary | ICD-10-CM | POA: Diagnosis present

## 2017-09-25 DIAGNOSIS — A419 Sepsis, unspecified organism: Secondary | ICD-10-CM

## 2017-09-25 HISTORY — DX: Multiple sclerosis: G35

## 2017-09-25 HISTORY — DX: Personal history of urinary calculi: Z87.442

## 2017-09-25 LAB — GASTROINTESTINAL PANEL BY PCR, STOOL (REPLACES STOOL CULTURE)

## 2017-09-25 LAB — CBC WITH DIFFERENTIAL/PLATELET
BASOS ABS: 0 10*3/uL (ref 0.0–0.1)
BASOS PCT: 0 %
EOS ABS: 0.2 10*3/uL (ref 0.0–0.7)
EOS PCT: 2 %
HCT: 42.9 % (ref 39.0–52.0)
Hemoglobin: 14 g/dL (ref 13.0–17.0)
LYMPHS ABS: 4 10*3/uL (ref 0.7–4.0)
Lymphocytes Relative: 40 %
MCH: 30.2 pg (ref 26.0–34.0)
MCHC: 32.6 g/dL (ref 30.0–36.0)
MCV: 92.7 fL (ref 78.0–100.0)
Monocytes Absolute: 0.3 10*3/uL (ref 0.1–1.0)
Monocytes Relative: 3 %
Neutro Abs: 5.5 10*3/uL (ref 1.7–7.7)
Neutrophils Relative %: 55 %
PLATELETS: 229 10*3/uL (ref 150–400)
RBC: 4.63 MIL/uL (ref 4.22–5.81)
RDW: 13.3 % (ref 11.5–15.5)
WBC: 10 10*3/uL (ref 4.0–10.5)

## 2017-09-25 LAB — COMPREHENSIVE METABOLIC PANEL
ALK PHOS: 55 U/L (ref 38–126)
ALT: 11 U/L — ABNORMAL LOW (ref 17–63)
ANION GAP: 10 (ref 5–15)
AST: 24 U/L (ref 15–41)
Albumin: 3.1 g/dL — ABNORMAL LOW (ref 3.5–5.0)
BUN: 29 mg/dL — ABNORMAL HIGH (ref 6–20)
CALCIUM: 8.7 mg/dL — AB (ref 8.9–10.3)
CO2: 22 mmol/L (ref 22–32)
Chloride: 108 mmol/L (ref 101–111)
Creatinine, Ser: 2.07 mg/dL — ABNORMAL HIGH (ref 0.61–1.24)
GFR, EST AFRICAN AMERICAN: 38 mL/min — AB (ref >60)
GFR, EST NON AFRICAN AMERICAN: 32 mL/min — AB (ref >60)
Glucose, Bld: 182 mg/dL — ABNORMAL HIGH (ref 65–99)
Potassium: 3.5 mmol/L (ref 3.5–5.1)
Sodium: 140 mmol/L (ref 135–145)
TOTAL PROTEIN: 6.8 g/dL (ref 6.5–8.1)
Total Bilirubin: 1.2 mg/dL (ref 0.3–1.2)

## 2017-09-25 LAB — C DIFFICILE QUICK SCREEN W PCR REFLEX
C DIFFICLE (CDIFF) ANTIGEN: NEGATIVE
C Diff interpretation: NOT DETECTED
C Diff toxin: NEGATIVE

## 2017-09-25 LAB — URINALYSIS, ROUTINE W REFLEX MICROSCOPIC
Bilirubin Urine: NEGATIVE
Glucose, UA: NEGATIVE mg/dL
HGB URINE DIPSTICK: NEGATIVE
Ketones, ur: NEGATIVE mg/dL
Leukocytes, UA: NEGATIVE
NITRITE: NEGATIVE
Protein, ur: 30 mg/dL — AB
SPECIFIC GRAVITY, URINE: 1.016 (ref 1.005–1.030)
Squamous Epithelial / LPF: NONE SEEN
pH: 5 (ref 5.0–8.0)

## 2017-09-25 LAB — I-STAT CG4 LACTIC ACID, ED
LACTIC ACID, VENOUS: 3.99 mmol/L — AB (ref 0.5–1.9)
LACTIC ACID, VENOUS: 2.12 mmol/L — AB (ref 0.5–1.9)

## 2017-09-25 LAB — TROPONIN I: TROPONIN I: 0.04 ng/mL — AB (ref <0.03)

## 2017-09-25 LAB — CBG MONITORING, ED: Glucose-Capillary: 171 mg/dL — ABNORMAL HIGH (ref 65–99)

## 2017-09-25 LAB — PROTIME-INR
INR: 1.08
PROTHROMBIN TIME: 13.9 s (ref 11.4–15.2)

## 2017-09-25 MED ORDER — CARVEDILOL 3.125 MG PO TABS
3.1250 mg | ORAL_TABLET | Freq: Two times a day (BID) | ORAL | Status: DC
  Administered 2017-09-25 – 2017-10-01 (×12): 3.125 mg via ORAL
  Filled 2017-09-25 (×14): qty 1

## 2017-09-25 MED ORDER — SALINE SPRAY 0.65 % NA SOLN
1.0000 | Freq: Two times a day (BID) | NASAL | Status: DC
  Administered 2017-09-25 – 2017-10-01 (×9): 1 via NASAL
  Filled 2017-09-25: qty 44

## 2017-09-25 MED ORDER — PANTOPRAZOLE SODIUM 40 MG PO TBEC
40.0000 mg | DELAYED_RELEASE_TABLET | Freq: Every day | ORAL | Status: DC
  Administered 2017-09-25 – 2017-10-01 (×7): 40 mg via ORAL
  Filled 2017-09-25 (×7): qty 1

## 2017-09-25 MED ORDER — SCOPOLAMINE 1 MG/3DAYS TD PT72
1.0000 | MEDICATED_PATCH | TRANSDERMAL | Status: DC
  Administered 2017-09-25 – 2017-09-28 (×2): 1.5 mg via TRANSDERMAL
  Filled 2017-09-25 (×2): qty 1

## 2017-09-25 MED ORDER — TACROLIMUS 1 MG PO CAPS
1.0000 mg | ORAL_CAPSULE | Freq: Two times a day (BID) | ORAL | Status: DC
  Administered 2017-09-25 – 2017-10-01 (×12): 1 mg via ORAL
  Filled 2017-09-25 (×14): qty 1

## 2017-09-25 MED ORDER — ONDANSETRON HCL 4 MG/2ML IJ SOLN
4.0000 mg | Freq: Once | INTRAMUSCULAR | Status: AC
  Administered 2017-09-25: 4 mg via INTRAVENOUS

## 2017-09-25 MED ORDER — ENOXAPARIN SODIUM 40 MG/0.4ML ~~LOC~~ SOLN
40.0000 mg | SUBCUTANEOUS | Status: DC
  Administered 2017-09-25 – 2017-09-30 (×6): 40 mg via SUBCUTANEOUS
  Filled 2017-09-25 (×7): qty 0.4

## 2017-09-25 MED ORDER — DIPHENOXYLATE-ATROPINE 2.5-0.025 MG PO TABS
1.0000 | ORAL_TABLET | Freq: Two times a day (BID) | ORAL | Status: DC | PRN
  Administered 2017-09-26 – 2017-09-30 (×6): 1 via ORAL
  Filled 2017-09-25 (×6): qty 1

## 2017-09-25 MED ORDER — PRAVASTATIN SODIUM 40 MG PO TABS
40.0000 mg | ORAL_TABLET | Freq: Every day | ORAL | Status: DC
  Administered 2017-09-25 – 2017-09-30 (×6): 40 mg via ORAL
  Filled 2017-09-25 (×6): qty 1

## 2017-09-25 MED ORDER — SODIUM CHLORIDE 0.9 % IV BOLUS (SEPSIS)
1000.0000 mL | Freq: Once | INTRAVENOUS | Status: AC
  Administered 2017-09-25: 1000 mL via INTRAVENOUS

## 2017-09-25 MED ORDER — SODIUM CHLORIDE 0.9 % IV BOLUS (SEPSIS)
500.0000 mL | Freq: Once | INTRAVENOUS | Status: AC
  Administered 2017-09-25: 500 mL via INTRAVENOUS

## 2017-09-25 MED ORDER — VANCOMYCIN HCL IN DEXTROSE 750-5 MG/150ML-% IV SOLN
750.0000 mg | INTRAVENOUS | Status: DC
  Administered 2017-09-26: 750 mg via INTRAVENOUS
  Filled 2017-09-25 (×2): qty 150

## 2017-09-25 MED ORDER — ACETAMINOPHEN 650 MG RE SUPP: 650.0000 mg | Freq: Four times a day (QID) | RECTAL | Status: DC | PRN

## 2017-09-25 MED ORDER — ACETAMINOPHEN 325 MG PO TABS
650.0000 mg | ORAL_TABLET | Freq: Four times a day (QID) | ORAL | Status: DC | PRN
  Administered 2017-09-25 – 2017-09-29 (×2): 650 mg via ORAL
  Filled 2017-09-25 (×3): qty 2

## 2017-09-25 MED ORDER — PIPERACILLIN-TAZOBACTAM 3.375 G IVPB
3.3750 g | Freq: Three times a day (TID) | INTRAVENOUS | Status: DC
  Administered 2017-09-25 – 2017-09-29 (×11): 3.375 g via INTRAVENOUS
  Filled 2017-09-25 (×12): qty 50

## 2017-09-25 MED ORDER — ONDANSETRON HCL 4 MG PO TABS: 4.0000 mg | ORAL_TABLET | Freq: Four times a day (QID) | ORAL | Status: DC | PRN

## 2017-09-25 MED ORDER — HYDROCODONE-ACETAMINOPHEN 5-325 MG PO TABS
1.0000 | ORAL_TABLET | Freq: Three times a day (TID) | ORAL | Status: DC | PRN
  Administered 2017-09-26 – 2017-09-30 (×10): 1 via ORAL
  Filled 2017-09-25 (×11): qty 1

## 2017-09-25 MED ORDER — LORATADINE 10 MG PO TABS
10.0000 mg | ORAL_TABLET | Freq: Every day | ORAL | Status: DC
  Administered 2017-09-25 – 2017-10-01 (×7): 10 mg via ORAL
  Filled 2017-09-25 (×7): qty 1

## 2017-09-25 MED ORDER — ONDANSETRON HCL 4 MG/2ML IJ SOLN
INTRAMUSCULAR | Status: AC
  Filled 2017-09-25: qty 2

## 2017-09-25 MED ORDER — PIPERACILLIN-TAZOBACTAM 3.375 G IVPB
3.3750 g | Freq: Three times a day (TID) | INTRAVENOUS | Status: DC
  Filled 2017-09-25: qty 50

## 2017-09-25 MED ORDER — PIPERACILLIN-TAZOBACTAM 3.375 G IVPB 30 MIN
3.3750 g | Freq: Once | INTRAVENOUS | Status: AC
  Administered 2017-09-25: 3.375 g via INTRAVENOUS
  Filled 2017-09-25: qty 50

## 2017-09-25 MED ORDER — ONDANSETRON HCL 4 MG/2ML IJ SOLN: 4.0000 mg | Freq: Four times a day (QID) | INTRAMUSCULAR | Status: DC | PRN

## 2017-09-25 MED ORDER — VANCOMYCIN HCL IN DEXTROSE 1-5 GM/200ML-% IV SOLN
1000.0000 mg | Freq: Once | INTRAVENOUS | Status: AC
  Administered 2017-09-25: 1000 mg via INTRAVENOUS
  Filled 2017-09-25 (×2): qty 200

## 2017-09-25 MED ORDER — SODIUM CHLORIDE 0.9 % IV SOLN
INTRAVENOUS | Status: DC
Start: 2017-09-25 — End: 2017-09-26
  Administered 2017-09-25: 16:00:00 via INTRAVENOUS

## 2017-09-25 MED ORDER — SODIUM CHLORIDE 0.9 % IV BOLUS (SEPSIS): 500.0000 mL | Freq: Once | INTRAVENOUS | Status: DC

## 2017-09-25 NOTE — ED Notes (Signed)
4mg  of Zofran per MD [facility]

## 2017-09-25 NOTE — ED Notes (Signed)
Pt followed by Neuro at [facility].  Pt had Speech pathologist examine his swallowing reflex in Aug 2018 and pt is not on thickened liquids or dysphagia diet.

## 2017-09-25 NOTE — ED Notes (Signed)
E-link called RN inquiring about amount of fluids Pt has infused. Pt has received 1000 cc NS from EMS. Pt has CHF and renal disorders. Pt has had 1500 cc NS including 500 cc from RN.  Pt to receive full NS infusions in 500 cc boluses Per MD [place].

## 2017-09-25 NOTE — ED Provider Notes (Addendum)
[facility] EMERGENCY DEPARTMENT Provider Note   CSN: [id number] Arrival date & time: 09/25/17  0941     History   Chief Complaint Chief Complaint  Patient presents with  . Altered Mental Status  . Hypotension    HPI Darin Vega is a 62 y.o. male.  Patient is a 62 year old male who presents after a syncopal episode.  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  Given that neurologic disease, he has dysarthria and uses a Print production planner.  He also has had some ongoing weakness in his right arm and right leg.  He uses a walker to ambulate.  He has had frequent syncopal episodes.  Reportedly by EMS, he is currently in hospice care.  He is not on home oxygen.  His wife found him this morning slumped over his walker and unresponsive.  His initial blood pressure was 70 systolic.  His heart rate was 125.  He was given 1 L of IV fluids.  He was noted to be hypoxic and placed on nonrebreather mask.  On arrival he oxygen saturations of 88% on 3 L and was replaced on nonrebreather mask.  At this point he is alert and talking.  He is following commands and will answer yes or no questions.  He is denying any pain or SOB.      Past Medical History:  Diagnosis Date  . CHF (congestive heart failure) ([place])    "before heart transplant in 2005  . 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 Headache(784.0)    "usually have one q other day; attributed to Fahr's syndrome" (09/25/2013)  . Heart transplanted ([place])   . History of stomach ulcers    "before heart transplant"  . Hypertension   . Migraine    "don't get them often anymore" (09/25/2013)  . Renal disorder    medication induced  . Syncope and collapse ~ 2010; 2011; 07/2013; 09/25/2013    Patient Active Problem List   Diagnosis Date Noted  . Diarrhea 09/26/2013    Class: Chronic  . Syncope 09/25/2013  . CKD (chronic kidney disease) stage 3, GFR 30-59  ml/min (HCC) 09/25/2013  . History of heart transplant ([place]) 09/25/2013  . Fahr's disease ([place]) 09/25/2013  . Heart transplanted [facility])     Past Surgical History:  Procedure Laterality Date  . CARDIAC DEFIBRILLATOR PLACEMENT  10/2001; 06/2003   "took 2nd one out before heart transplant in 04/2004"  . CATARACT EXTRACTION W/ INTRAOCULAR LENS  IMPLANT, BILATERAL Bilateral ?2009  . HEART TRANSPLANT  04/2004  . HEART TRANSPLANT         Home Medications    Prior to Admission medications   Medication Sig Start Date End Date Taking? Authorizing Provider  amLODipine (NORVASC) 2.5 MG tablet Take 2.5 mg by mouth at bedtime. 03/25/17  Yes [provider]  aspirin EC 81 MG tablet Take 81 mg by mouth daily.   Yes [provider]  carvedilol (COREG) 3.125 MG tablet Take 3.125 mg by mouth 2 (two) times daily. 03/25/17  Yes [provider]  cholecalciferol (VITAMIN D) 1000 units tablet Take 1,000 Units by mouth daily.   Yes [provider]  diphenoxylate-atropine (LOMOTIL) 2.5-0.025 MG per tablet Take 1 tablet by mouth 2 (two) times daily between meals as needed for diarrhea or loose stools. 09/26/13  Yes Leanna Battles, MD  EPINEPHrine 0.3 mg/0.3 mL IJ SOAJ injection  Inject 0.3 mLs (0.3 mg total) into the muscle once. Patient taking differently: Inject 0.3 mg into the muscle once as needed (severe allergic reaction).  07/12/14  Yes Riki Altes, MD  HYDROcodone-acetaminophen (NORCO/VICODIN) 5-325 MG per tablet Take 1 tablet by mouth 3 (three) times daily as needed (pain). For pain 07/27/13  Yes [provider]  loratadine (CLARITIN) 10 MG tablet Take 10 mg by mouth daily with lunch.    Yes [provider]  Multiple Vitamin (MULTIVITAMIN WITH MINERALS) TABS tablet Take 1 tablet by mouth 3 (three) times daily. [facility]   Yes [provider]  pantoprazole (PROTONIX) 40 MG tablet Take 40 mg by mouth daily.    Yes [provider]  Phenylephrine-APAP-Guaifenesin (TYLENOL SINUS SEVERE PO) Take 1 tablet by mouth daily as needed (congestion).   Yes [provider]  Polyethylene Glycol 400 (BLINK TEARS OP) Place 1 drop into both eyes daily as needed (dry eyes).   Yes [provider]  pravastatin (PRAVACHOL) 80 MG tablet Take 40 mg by mouth at bedtime.   Yes [provider]  sodium chloride (OCEAN) 0.65 % SOLN nasal spray Place 1 spray into both nostrils 2 (two) times daily.   Yes [provider]  tacrolimus (PROGRAF) 1 MG capsule Take 1 mg by mouth 2 (two) times daily.  09/04/13  Yes [provider]  triamcinolone (NASACORT ALLERGY 24HR) 55 MCG/ACT AERO nasal inhaler Place 2 sprays into the nose daily.   Yes [provider]  vitamin C (ASCORBIC ACID) 500 MG tablet Take 500 mg by mouth 2 (two) times daily.   Yes [provider]    Family History No family history on file.  Social History Social History   Tobacco Use  . Smoking status: Never Smoker  . Smokeless tobacco: Never Used  Substance Use Topics  . Alcohol use: No  . Drug use: No     Allergies   Atrovent [ipratropium]; Other; Peanuts [peanut oil]; Fluticasone; and Ace inhibitors   Review of Systems Review of Systems  Unable to perform ROS: Mental status change     Physical Exam Updated Vital Signs BP 123/82   Pulse 90   Temp (!) 101.7 F (38.7 C) (Rectal)   Resp (!) 25   SpO2 96%   Physical Exam  Constitutional: He appears well-developed and well-nourished.  HENT:  Head: Normocephalic and atraumatic.  Eyes: Pupils are equal, round, and reactive to light.  Neck: Normal range of motion. Neck supple.  Cardiovascular: Regular rhythm and normal heart sounds. Tachycardia present.  Pulmonary/Chest: Effort normal and breath sounds normal. Tachypnea noted. No respiratory distress. He has no wheezes. He has no rales. He exhibits no tenderness.  Abdominal: Soft. Bowel sounds  are normal. There is no tenderness. There is no rebound and no guarding.  Musculoskeletal: Normal range of motion. He exhibits no edema.  Lymphadenopathy:    He has no cervical adenopathy.  Neurological: He is alert.  Patient is alert and appears to be oriented.  He is answering yes or no questions appropriately.  He has symmetric grip strength but seems to be a little weaker in his right leg as compared to his left which sounds chronic in nature.  He has an ongoing tremor in all extremities which also reportedly is chronic.  Skin: Skin is warm and dry. No rash noted.  Psychiatric: He has a normal mood and affect.     ED Treatments / Results  Labs (all  labs ordered are listed, but only abnormal results are displayed) Labs Reviewed  COMPREHENSIVE METABOLIC PANEL - Abnormal; Notable for the following components:      Result Value   Glucose, Bld 182 (*)    BUN 29 (*)    Creatinine, Ser 2.07 (*)    Calcium 8.7 (*)    Albumin 3.1 (*)    ALT 11 (*)    GFR calc non Af Amer 32 (*)    GFR calc Af Amer 38 (*)    All other components within normal limits  CBG MONITORING, ED - Abnormal; Notable for the following components:   Glucose-Capillary 171 (*)    All other components within normal limits  I-STAT CG4 LACTIC ACID, ED - Abnormal; Notable for the following components:   Lactic Acid, Venous 3.99 (*)    All other components within normal limits  I-STAT CG4 LACTIC ACID, ED - Abnormal; Notable for the following components:   Lactic Acid, Venous 2.12 (*)    All other components within normal limits  C DIFFICILE QUICK SCREEN W PCR REFLEX  CULTURE, BLOOD (ROUTINE X 2)  CULTURE, BLOOD (ROUTINE X 2)  GASTROINTESTINAL PANEL BY PCR, STOOL (REPLACES STOOL CULTURE)  CBC WITH DIFFERENTIAL/PLATELET  PROTIME-INR  URINALYSIS, ROUTINE W REFLEX MICROSCOPIC    EKG  EKG Interpretation  Date/Time:  Sunday September 25 2017 11:13:06 EST Ventricular Rate:  108 PR Interval:    QRS Duration: 95 QT  Interval:  360 QTC Calculation: 483 R Axis:   25 Text Interpretation:  Sinus tachycardia RSR' in V1 or V2, right VCD or RVH Nonspecific T abnrm, anterolateral leads Borderline prolonged QT interval since last tracing no significant change Confirmed by Malvin Johns [id number]) on 09/25/2017 11:22:50 AM       Radiology Ct Head Wo Contrast  Result Date: 09/25/2017 CLINICAL DATA:  Patient was found slumped over walker earlier this morning, unresponsive. EXAM: CT HEAD WITHOUT CONTRAST TECHNIQUE: Contiguous axial images were obtained from the base of the skull through the vertex without intravenous contrast. COMPARISON:  Multiple priors, most recent 04/08/2017. FINDINGS: Brain: Extensive brain parenchymal calcification of a symmetric nature, consistent with Fahr's disease. This involves the supratentorial white matter, deep nuclei, brainstem, and cerebellum. An acute area of infarction or hemorrhage is not identified. Vascular: No hyperdense vessel. Skull: Calvarium intact. Sinuses/Orbits: No layering fluid. Other: Compared with priors, a similar appearance is noted. IMPRESSION: Extensive brain parenchymal calcification consistent with Fahr's disease. No acute findings are evident. Electronically Signed   By: Staci Righter M.D.   On: 09/25/2017 13:47   Dg Chest Portable 1 View  Result Date: 09/25/2017 CLINICAL DATA:  62 year old male with altered mental status EXAM: PORTABLE CHEST 1 VIEW COMPARISON:  Prior chest x-ray and chest CT 03/29/2016 FINDINGS: Stable cardiac and mediastinal contours. Implantable loop recorder projects over the left chest. Epicardial pacing leads are also visible. The lungs are clear. No focal airspace consolidation visualized. Chronic atelectasis in the inferior lingula and left lower lobe. Stable chronic bronchitic changes. No pneumothorax or pleural effusion. No acute osseous abnormality. IMPRESSION: No active disease. Electronically Signed   By: Jacqulynn Cadet M.D.   On: 09/25/2017  10:34   Dg Abd 2 Views  Result Date: 09/25/2017 CLINICAL DATA:  Found this morning slumped over his walker and unresponsive, abdominal pain and watery stools, history CHF, heart transplant, Fahr syndrome EXAM: ABDOMEN - 2 VIEW COMPARISON:  None FINDINGS: Epicardial pacing wires and loop recorder project over heart. Lung bases clear. Normal bowel  gas pattern. No bowel dilatation or bowel wall thickening. No free air. Nonobstructing RIGHT renal calculus 8 x 5 mm diameter. No acute osseous findings. IMPRESSION: Nonobstructing 8 x 5 mm RIGHT renal calculus. Normal bowel gas pattern Electronically Signed   By: Lavonia Dana M.D.   On: 09/25/2017 13:47    Procedures Procedures (including critical care time)  Medications Ordered in ED Medications  vancomycin (VANCOCIN) IVPB 1000 mg/200 mL premix (1,000 mg Intravenous New Bag/Given 09/25/17 1341)  piperacillin-tazobactam (ZOSYN) IVPB 3.375 g (not administered)  vancomycin (VANCOCIN) IVPB 750 mg/150 ml premix (not administered)  ondansetron (ZOFRAN) 4 MG/2ML injection (not administered)  piperacillin-tazobactam (ZOSYN) IVPB 3.375 g (0 g Intravenous Stopped 09/25/17 1156)  sodium chloride 0.9 % bolus 500 mL (0 mLs Intravenous Stopped 09/25/17 1202)  sodium chloride 0.9 % bolus 500 mL (0 mLs Intravenous Stopped 09/25/17 1328)  ondansetron (ZOFRAN) injection 4 mg (4 mg Intravenous Given 09/25/17 1249)     Initial Impression / Assessment and Plan / ED Course  I have reviewed the triage vital signs and the nursing notes.  Pertinent labs & imaging results that were available during my care of the patient were reviewed by me and considered in my medical decision making (see chart for details).     Patient is a 62 year old male with a history of heart transplant on immunosuppressants as well as progressive Fahr's syndrome.  He presents today after a syncopal episode.  He was found by his wife to be unresponsive slumped over his walker.  There is no fall or trauma  associated with this.  He was noted to be hypotensive and hypoxic on arrival.  He was maintained on a nonrebreather mask although this was transitioned to nasal cannula and he is doing okay on a nasal cannula at this point.  He had an elevated lactate and was treated per septic protocol.  He was given 1 L of IV fluids per EMS and was given 1.5 L in the emergency department to make a total of 30 cc/kg.  He was started on broad-spectrum IV antibiotics.  At this point I do not see a source of infection.  His chest x-ray is clear without evidence of pneumonia.  He does have some profuse diarrhea which his wife says he has had frequently in the past.  It is nonbloody.  He had one episode of vomiting in the ED but he has no abdominal pain on exam.  I did do an acute abdominal series which shows no evidence of obstruction.  His blood pressure has improved.  He was hypothermic on arrival and was placed on a Retail banker.  However his temperature did spike up to 101.7 and a bear hugger was removed.  I will consult the hospitalist for admission and ongoing treatment.  I spoke to Dyanne Carrel who will admit the pt.  Final Clinical Impressions(s) / ED Diagnoses   Final diagnoses:  Dehydration  AKI (acute kidney injury) ([place])  Sepsis, due to unspecified organism ([place])  Diarrhea, unspecified type  Syncope, unspecified syncope type    ED Discharge Orders    None       Malvin Johns, MD 09/25/17 1354    Malvin Johns, MD 09/25/17 1355

## 2017-09-25 NOTE — ED Notes (Signed)
Pt able to use urinal with assistance.

## 2017-09-25 NOTE — H&P (Signed)
History and Physical    Darin Vega FAO:[id number] DOB: 07-03-1956 DOA: 09/25/2017  PCP: Leanna Battles, MD Patient coming from: home  Chief Complaint: syncope/altered mental status  HPI: Darin Vega is a 62 y.o. male with medical history significant for orthotopic heart transplant 2005, Fahr disease, chronic kidney disease, GERD, syncope and collapse, hypertension, presents to the emergency Department chief complaint syncope. Initial evaluation reveals hypothermia hypotension tachycardia hypoxia. [facility] are asked to admit.  Information is obtained from the chart and the patient. Reportedly his wife found him this morning slumped over his walker. He was unresponsive. EMS was called as her blood pressure was 70 systolic heart rate 696. He is provided with 1 L IV fluids. He was also noted to be hypoxic and placed on a nonrebreather mask. He got to the emergency department his oxygen saturation level was greater than 90%. He is provided with IV fluids antibiotics. At the time of admission he was alert and oriented hemodynamically stable his temperature did spike to 101.7 rectally remains tachycardic. Denies headache dizziness chest pain palpitation shortness of breath dizziness visual disturbances. He denies abdominal pain nausea vomiting. He does report just prior to that he experienced sudden sharp abdominal pain and had episode large amount of diarrhea. He denies cough fever chills recent travel or sick contacts. He denies lower extremity edema.    ED Course: At the time of admission he is max temperature 101.7 rectally tachycardic with 117 blood pressure is stable he's not hypoxic he is alert and oriented.  Review of Systems: As per HPI otherwise all other systems reviewed and are negative.   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 Patient has ataxia uses  walker and frequent falls  Past Medical History:  Diagnosis Date  . CHF (congestive heart failure) ([place])    "before heart transplant in 2005  . 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 Headache(784.0)    "usually have one q other day; attributed to Fahr's syndrome" (09/25/2013)  . Heart transplanted ([place])   . History of stomach ulcers    "before heart transplant"  . Hypertension   . Migraine    "don't get them often anymore" (09/25/2013)  . Multiple sclerosis ([place])   . Renal disorder    medication induced  . Syncope and collapse ~ 2010; 2011; 07/2013; 09/25/2013    Past Surgical History:  Procedure Laterality Date  . CARDIAC DEFIBRILLATOR PLACEMENT  10/2001; 06/2003   "took 2nd one out before heart transplant in 04/2004"  . CATARACT EXTRACTION W/ INTRAOCULAR LENS  IMPLANT, BILATERAL Bilateral ?2009  . HEART TRANSPLANT  04/2004  . HEART TRANSPLANT      Social History   Socioeconomic History  . Marital status: Married    Spouse name: Not on file  . Number of children: Not on file  . Years of education: Not on file  . Highest education level: Not on file  Social Needs  . Financial resource strain: Not on file  . Food insecurity - worry: Not on file  . Food insecurity - inability: Not on file  . Transportation needs - medical: Not on file  . Transportation needs - non-medical: Not on file  Occupational History  . Not on file  Tobacco Use  . Smoking status: Never Smoker  . Smokeless tobacco: Never Used  Substance and Sexual Activity  . Alcohol use: No  . Drug use: No  .  Sexual activity: No  Other Topics Concern  . Not on file  Social History Narrative  . Not on file    Allergies  Allergen Reactions  . Atrovent [Ipratropium] Anaphylaxis  . Other Anaphylaxis    Reaction to tree nuts  . Peanuts [Peanut Oil] Anaphylaxis  . Fluticasone Nausea Only  . Ace Inhibitors Cough    No family history on file.  Prior to Admission medications   Medication Sig Start Date End Date Taking?  Authorizing Provider  amLODipine (NORVASC) 2.5 MG tablet Take 2.5 mg by mouth at bedtime. 03/25/17  Yes [provider]  aspirin EC 81 MG tablet Take 81 mg by mouth daily.   Yes [provider]  carvedilol (COREG) 3.125 MG tablet Take 3.125 mg by mouth 2 (two) times daily. 03/25/17  Yes [provider]  cholecalciferol (VITAMIN D) 1000 units tablet Take 1,000 Units by mouth daily.   Yes [provider]  diphenoxylate-atropine (LOMOTIL) 2.5-0.025 MG per tablet Take 1 tablet by mouth 2 (two) times daily between meals as needed for diarrhea or loose stools. 09/26/13  Yes Leanna Battles, MD  EPINEPHrine 0.3 mg/0.3 mL IJ SOAJ injection Inject 0.3 mLs (0.3 mg total) into the muscle once. Patient taking differently: Inject 0.3 mg into the muscle once as needed (severe allergic reaction).  07/12/14  Yes Riki Altes, MD  HYDROcodone-acetaminophen (NORCO/VICODIN) 5-325 MG per tablet Take 1 tablet by mouth 3 (three) times daily as needed (pain). For pain 07/27/13  Yes [provider]  loratadine (CLARITIN) 10 MG tablet Take 10 mg by mouth daily with lunch.    Yes [provider]  Multiple Vitamin (MULTIVITAMIN WITH MINERALS) TABS tablet Take 1 tablet by mouth 3 (three) times daily. [facility]   Yes [provider]  pantoprazole (PROTONIX) 40 MG tablet Take 40 mg by mouth daily.    Yes [provider]  Phenylephrine-APAP-Guaifenesin (TYLENOL SINUS SEVERE PO) Take 1 tablet by mouth daily as needed (congestion).   Yes [provider]  Polyethylene Glycol 400 (BLINK TEARS OP) Place 1 drop into both eyes daily as needed (dry eyes).   Yes [provider]  pravastatin (PRAVACHOL) 80 MG tablet Take 40 mg by mouth at bedtime.   Yes [provider]  scopolamine (TRANSDERM-SCOP) 1 MG/3DAYS Place 1 patch onto the skin every 3 (three) days. 09/25/17  Yes [provider]  sodium chloride (OCEAN) 0.65 %  SOLN nasal spray Place 1 spray into both nostrils 2 (two) times daily.   Yes [provider]  tacrolimus (PROGRAF) 1 MG capsule Take 1 mg by mouth 2 (two) times daily.  09/04/13  Yes [provider]  triamcinolone (NASACORT ALLERGY 24HR) 55 MCG/ACT AERO nasal inhaler Place 2 sprays into the nose daily.   Yes [provider]  vitamin C (ASCORBIC ACID) 500 MG tablet Take 500 mg by mouth 2 (two) times daily.   Yes [provider]    Physical Exam: Vitals:   09/25/17 1345 09/25/17 1415 09/25/17 1445 09/25/17 1615  BP: 123/82 118/73 123/80 (!) 130/94  Pulse: 90 (!) 124 (!) 116 (!) 118  Resp: (!) 25 17 (!) 21 (!) 30  Temp:      TempSrc:      SpO2: 96% 99% 97% 96%     General:  Appears calm and comfortable sitting up in bed in no acute distress Eyes:  PERRL, EOMI, normal lids, iris ENT:  grossly normal hearing, lips & tongue, mucous  membranes of his mouth are dry but pink Neck:  no LAD, masses or thyromegaly Cardiovascular:  Tachycardic but regular no m/r/g. No LE edema.  Respiratory:  CTA bilaterally, no w/r/r. Normal respiratory effort. Abdomen:  soft, ntnd, bowel sounds no guarding or rebounding Skin:  no rash or induration seen on limited exam Musculoskeletal:  grossly normal tone BUE/BLE, good ROM, no bony abnormality Psychiatric:  grossly normal mood and affect, speech fluent and appropriate, AOx3 Neurologic:  Patient is alert and oriented to self and place. Has dysarthria. Speech is slow. Follows commands. Symmetric grip 4 out of 5 tremor all extremities  Labs on Admission: I have personally reviewed following labs and imaging studies  CBC: Recent Labs  Lab 09/25/17 0958  WBC 10.0  NEUTROABS 5.5  HGB 14.0  HCT 42.9  MCV 92.7  PLT 865   Basic Metabolic Panel: Recent Labs  Lab 09/25/17 0958  NA 140  K 3.5  CL 108  CO2 22  GLUCOSE 182*  BUN 29*  CREATININE 2.07*  CALCIUM 8.7*   GFR: CrCl cannot be calculated (Unknown ideal  weight.). Liver Function Tests: Recent Labs  Lab 09/25/17 0958  AST 24  ALT 11*  ALKPHOS 55  BILITOT 1.2  PROT 6.8  ALBUMIN 3.1*   No results for input(s): LIPASE, AMYLASE in the last 168 hours. No results for input(s): AMMONIA in the last 168 hours. Coagulation Profile: Recent Labs  Lab 09/25/17 0958  INR 1.08   Cardiac Enzymes: Recent Labs  Lab 09/25/17 1400  TROPONINI 0.04*   BNP (last 3 results) No results for input(s): PROBNP in the last 8760 hours. HbA1C: No results for input(s): HGBA1C in the last 72 hours. CBG: Recent Labs  Lab 09/25/17 0951  GLUCAP 171*   Lipid Profile: No results for input(s): CHOL, HDL, LDLCALC, TRIG, CHOLHDL, LDLDIRECT in the last 72 hours. Thyroid Function Tests: No results for input(s): TSH, T4TOTAL, FREET4, T3FREE, THYROIDAB in the last 72 hours. Anemia Panel: No results for input(s): VITAMINB12, FOLATE, FERRITIN, TIBC, IRON, RETICCTPCT in the last 72 hours. Urine analysis:    Component Value Date/Time   COLORURINE YELLOW 09/25/2017 1443   APPEARANCEUR HAZY (A) 09/25/2017 1443   LABSPEC 1.016 09/25/2017 1443   PHURINE 5.0 09/25/2017 1443   GLUCOSEU NEGATIVE 09/25/2017 1443   HGBUR NEGATIVE 09/25/2017 1443   BILIRUBINUR NEGATIVE 09/25/2017 1443   KETONESUR NEGATIVE 09/25/2017 1443   PROTEINUR 30 (A) 09/25/2017 1443   UROBILINOGEN 0.2 12/14/2014 1259   NITRITE NEGATIVE 09/25/2017 1443   LEUKOCYTESUR NEGATIVE 09/25/2017 1443    Creatinine Clearance: CrCl cannot be calculated (Unknown ideal weight.).  Sepsis Labs: @LABRCNTIP (procalcitonin:4,lacticidven:4) ) Recent Results (from the past 240 hour(s))  C difficile quick scan w PCR reflex     Status: None   Collection Time: 09/25/17 11:52 AM  Result Value Ref Range Status   C Diff antigen NEGATIVE NEGATIVE Final   C Diff toxin NEGATIVE NEGATIVE Final   C Diff interpretation No C. difficile detected.  Final     Radiological Exams on Admission: Ct Head Wo  Contrast  Result Date: 09/25/2017 CLINICAL DATA:  Patient was found slumped over walker earlier this morning, unresponsive. EXAM: CT HEAD WITHOUT CONTRAST TECHNIQUE: Contiguous axial images were obtained from the base of the skull through the vertex without intravenous contrast. COMPARISON:  Multiple priors, most recent 04/08/2017. FINDINGS: Brain: Extensive brain parenchymal calcification of a symmetric nature, consistent with Fahr's disease. This involves the supratentorial white matter, deep nuclei, brainstem, and cerebellum. An  acute area of infarction or hemorrhage is not identified. Vascular: No hyperdense vessel. Skull: Calvarium intact. Sinuses/Orbits: No layering fluid. Other: Compared with priors, a similar appearance is noted. IMPRESSION: Extensive brain parenchymal calcification consistent with Fahr's disease. No acute findings are evident. Electronically Signed   By: Staci Righter M.D.   On: 09/25/2017 13:47   Dg Chest Portable 1 View  Result Date: 09/25/2017 CLINICAL DATA:  62 year old male with altered mental status EXAM: PORTABLE CHEST 1 VIEW COMPARISON:  Prior chest x-ray and chest CT 03/29/2016 FINDINGS: Stable cardiac and mediastinal contours. Implantable loop recorder projects over the left chest. Epicardial pacing leads are also visible. The lungs are clear. No focal airspace consolidation visualized. Chronic atelectasis in the inferior lingula and left lower lobe. Stable chronic bronchitic changes. No pneumothorax or pleural effusion. No acute osseous abnormality. IMPRESSION: No active disease. Electronically Signed   By: Jacqulynn Cadet M.D.   On: 09/25/2017 10:34   Dg Abd 2 Views  Result Date: 09/25/2017 CLINICAL DATA:  Found this morning slumped over his walker and unresponsive, abdominal pain and watery stools, history CHF, heart transplant, Fahr syndrome EXAM: ABDOMEN - 2 VIEW COMPARISON:  None FINDINGS: Epicardial pacing wires and loop recorder project over heart. Lung bases  clear. Normal bowel gas pattern. No bowel dilatation or bowel wall thickening. No free air. Nonobstructing RIGHT renal calculus 8 x 5 mm diameter. No acute osseous findings. IMPRESSION: Nonobstructing 8 x 5 mm RIGHT renal calculus. Normal bowel gas pattern Electronically Signed   By: Lavonia Dana M.D.   On: 09/25/2017 13:47    EKG: Independently reviewed. Sinus tachycardia RSR' in V1 or V2, right VCD or RVH Nonspecific T abnrm, anterolateral leads Borderline prolonged QT interval   Assessment/Plan Principal Problem:   Acute encephalopathy Active Problems:   CKD (chronic kidney disease) stage 3, GFR 30-59 ml/min (HCC)   SIRS (systemic inflammatory response syndrome) (HCC)   Acute respiratory failure (HCC)   Elevated lactic acid level   Hypothermia   Tachycardia   #1. Acute encephalopathy/syncope and collapse. Chart review indicates patient with history of same. On presentation he was hypothermic hypotensive, hypoxic with a temperature of 101.7 rectally, tachycardic and elevated lactic acid. He is provided with IV fluids, bare hugger, broad-spectrum antibiotics oxygen supplementation.  CT of the head without acute changes. chest x-ray without pulmonary process. Abdominal x-ray with renal calculi no obstruction no free air. Urinalysis with protein and rare bacteria otherwise within the limits of normal. Troponin 0.04. EKG as noted above. Lactic acid trending down. Chart review indicates patient has had extensive syncope workup at [facility] by his transplant team which was unrevealing and therefore sent to the syncope clinic where he underwent tilt table test was positive for vasodepressor syncope. Past EEGs were negative. -Admit -IV fluids -Follow blood cultures -Follow urine culture -Antibiotics for sirs  #2. Sirs. Patient presented with hypothermia, hypotensive, elevated lactic acid, tachycardia, encephalopathy, hypoxia. All of which have resolved at the time of admission. He was provided with IV  fluids and antibiotics. Temperature went up to 101.7 rectally. Asked x-ray as noted above. Unremarkable. -Follow blood cultures -Continue antibiotics -Continue IV fluids -Track lactic acid -Monitor -Repeat chest x-ray in the a.m. if no improvement  #3. Acute respiratory failure related to #1. Oxygen saturation level 88% per EMS report. Was placed on a nonrebreather. At the time of admission oxygen saturation level greater than 90% on 3 L nasal cannula. Chest x-ray as noted above -Oxygen supplementation as indicated -Antibiotics as  noted above -monitor  #4. Chronic kidney disease. Stage III. Creatinine 2.07. Chart review indicates this is close to his baseline. -Hold nephrotoxins -IV fluids -Monitor urine output -Recheck in the morning  #5. Fahr's disease. Followed in the neurology clinic at [facility]. Care everywhere reveals recent visit with neurology. Documentation states symptoms progressive etiology unclear. See note dated 08/26/2015. Plan for further genetic testing follow-up in 4 months -Continue home meds    DVT prophylaxis: lovenox  Code Status: dnr  Family Communication: nephew at bediside  Disposition Plan: home  Consults called: none  Admission status: inpatient    Radene Gunning MD [facility]  If 7PM-7AM, please contact night-coverage [email or web address] Password [email or web address]  09/25/2017, 5:04 PM

## 2017-09-25 NOTE — ED Notes (Signed)
Pt taken off [facility]

## 2017-09-25 NOTE — ED Notes (Signed)
I Stat Lactic Acid results shown to Dr. Tamera Punt

## 2017-09-25 NOTE — ED Notes (Signed)
Pt having constant loose watery stool.

## 2017-09-25 NOTE — Progress Notes (Signed)
Pharmacy Antibiotic Note  Darin Vega is a 62 y.o. male admitted on 09/25/2017 with sepsis.  Pharmacy has been consulted for vancomycin and Zosyn dosing.  Give one time doses in the ED. SCr 2.07, CrCl ~34ml/min   Plan: Start Zosyn 3.375 gm IV q8h (4 hour infusion) Start vancomycin 750mg  IV Q24h Monitor clinical picture, renal function, VT prn F/U C&S, abx deescalation / LOT  Temp (24hrs), Avg:96.9 F (36.1 C), Min:96.9 F (36.1 C), Max:96.9 F (36.1 C)  Recent Labs  Lab 09/25/17 0958 09/25/17 1000  WBC 10.0  --   LATICACIDVEN  --  3.99*    CrCl cannot be calculated (Patient's most recent lab result is older than the maximum 21 days allowed.).    Allergies  Allergen Reactions  . Atrovent [Ipratropium] Anaphylaxis  . Other Anaphylaxis    Reaction to tree nuts  . Peanuts [Peanut Oil] Anaphylaxis  . Fluticasone Nausea Only  . Ace Inhibitors Cough    Thank you for allowing pharmacy to be a part of this patient's care.  Reginia Naas 09/25/2017 11:06 AM

## 2017-09-25 NOTE — Progress Notes (Signed)
09/25/2017 1815 Received pt to room 4E-17 from ED.  Pt is A&Ox4.  Does have difficulty speaking D/T his Fahr Disease.  Nursing station alerted to check on pt right away when calls.  Tele monitor applied and CCMD notified.  Oriented to room, call light and bed.  Call bell in reach, family at bedside. [name]

## 2017-09-25 NOTE — ED Triage Notes (Signed)
Pt here from home via [facility].  Wife found pt this am slumped over his walker and unresponsive.  Initial bp was 70/palp and pt was tachypneic cbg 204 hr 125 st.  Placed on NRB and given 1000 ml bolus.  Mentation improved after bolus and O2.  Pt has tremors and is aphasic d/t a neurological disease called Farrs (?) and has just been placed on [facility]osp[facility] for this disease.  Sats of 88% on 3 L, hr 123 , rr 28 bp 80/62.

## 2017-09-26 DIAGNOSIS — R55 Syncope and collapse: Secondary | ICD-10-CM

## 2017-09-26 LAB — CBC
HCT: 35.5 % — ABNORMAL LOW (ref 39.0–52.0)
Hemoglobin: 11.1 g/dL — ABNORMAL LOW (ref 13.0–17.0)
MCH: 29.1 pg (ref 26.0–34.0)
MCHC: 31.3 g/dL (ref 30.0–36.0)
MCV: 92.9 fL (ref 78.0–100.0)
PLATELETS: 157 10*3/uL (ref 150–400)
RBC: 3.82 MIL/uL — AB (ref 4.22–5.81)
RDW: 13.4 % (ref 11.5–15.5)
WBC: 9.3 10*3/uL (ref 4.0–10.5)

## 2017-09-26 LAB — BASIC METABOLIC PANEL
Anion gap: 7 (ref 5–15)
BUN: 29 mg/dL — AB (ref 6–20)
CO2: 24 mmol/L (ref 22–32)
CREATININE: 2.06 mg/dL — AB (ref 0.61–1.24)
Calcium: 8.1 mg/dL — ABNORMAL LOW (ref 8.9–10.3)
Chloride: 109 mmol/L (ref 101–111)
GFR, EST AFRICAN AMERICAN: 38 mL/min — AB (ref >60)
GFR, EST NON AFRICAN AMERICAN: 33 mL/min — AB (ref >60)
Glucose, Bld: 102 mg/dL — ABNORMAL HIGH (ref 65–99)
Potassium: 4.1 mmol/L (ref 3.5–5.1)
SODIUM: 140 mmol/L (ref 135–145)

## 2017-09-26 LAB — HIV ANTIBODY (ROUTINE TESTING W REFLEX): HIV Screen 4th Generation wRfx: NONREACTIVE

## 2017-09-26 LAB — TROPONIN I

## 2017-09-26 MED ORDER — ORAL CARE MOUTH RINSE
15.0000 mL | Freq: Two times a day (BID) | OROMUCOSAL | Status: DC
  Administered 2017-09-26 – 2017-10-01 (×5): 15 mL via OROMUCOSAL

## 2017-09-26 MED ORDER — VITAMIN C 500 MG PO TABS
500.0000 mg | ORAL_TABLET | Freq: Two times a day (BID) | ORAL | Status: DC
  Administered 2017-09-26 – 2017-10-01 (×10): 500 mg via ORAL
  Filled 2017-09-26 (×11): qty 1

## 2017-09-26 MED ORDER — HYPROMELLOSE (GONIOSCOPIC) 2.5 % OP SOLN
Freq: Every day | OPHTHALMIC | Status: DC | PRN
  Filled 2017-09-26: qty 15

## 2017-09-26 MED ORDER — ASPIRIN EC 81 MG PO TBEC
81.0000 mg | DELAYED_RELEASE_TABLET | Freq: Every day | ORAL | Status: DC
  Administered 2017-09-26 – 2017-10-01 (×6): 81 mg via ORAL
  Filled 2017-09-26 (×6): qty 1

## 2017-09-26 MED ORDER — PHENYLEPHRINE-APAP-GUAIFENESIN 5-325-200 MG PO TABS: ORAL_TABLET | Freq: Every day | ORAL | Status: DC | PRN

## 2017-09-26 MED ORDER — VITAMIN D 1000 UNITS PO TABS
1000.0000 [IU] | ORAL_TABLET | Freq: Every day | ORAL | Status: DC
  Administered 2017-09-26 – 2017-10-01 (×6): 1000 [IU] via ORAL
  Filled 2017-09-26 (×6): qty 1

## 2017-09-26 MED ORDER — SCOPOLAMINE 1 MG/3DAYS TD PT72: 1.0000 | MEDICATED_PATCH | TRANSDERMAL | Status: DC

## 2017-09-26 MED ORDER — TRIAMCINOLONE ACETONIDE 55 MCG/ACT NA AERO
2.0000 | INHALATION_SPRAY | Freq: Every day | NASAL | Status: DC
  Administered 2017-09-26 – 2017-10-01 (×5): 2 via NASAL
  Filled 2017-09-26: qty 21.6

## 2017-09-26 MED ORDER — POLYVINYL ALCOHOL 1.4 % OP SOLN
1.0000 [drp] | OPHTHALMIC | Status: DC | PRN
  Administered 2017-09-27 – 2017-09-28 (×2): 1 [drp] via OPHTHALMIC
  Filled 2017-09-26: qty 15

## 2017-09-26 NOTE — Evaluation (Signed)
Clinical/Bedside Swallow Evaluation Patient Details  Name: Darin Vega MRN: [id number] Date of Birth: [birth year]  Today's Date: 09/26/2017 Time: SLP Start Time (ACUTE ONLY): 1412 SLP Stop Time (ACUTE ONLY): 1506 SLP Time Calculation (min) (ACUTE ONLY): 54 min  Past Medical History:  Past Medical History:  Diagnosis Date  . CHF (congestive heart failure) ([place])    "before heart transplant in 2005  . 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 Headache(784.0)    "usually have one q other day; attributed to Fahr's syndrome" (09/25/2013)  . Heart transplanted ([place])   . History of stomach ulcers    "before heart transplant"  . Hypertension   . Migraine    "don't get them often anymore" (09/25/2013)  . Multiple sclerosis ([place])   . Renal disorder    medication induced  . Syncope and collapse ~ 2010; 2011; 07/2013; 09/25/2013   Past Surgical History:  Past Surgical History:  Procedure Laterality Date  . CARDIAC DEFIBRILLATOR PLACEMENT  10/2001; 06/2003   "took 2nd one out before heart transplant in 04/2004"  . CATARACT EXTRACTION W/ INTRAOCULAR LENS  IMPLANT, BILATERAL Bilateral ?2009  . HEART TRANSPLANT  04/2004  . HEART TRANSPLANT     HPI:  Pt is a 62 y.o.malewith medical history significantfor orthotopic heart transplant 2005(followed by [facility] cardiology), Fahr disease(followed by [facility] neurology),chronic kidney disease, GERD, syncope and collapse(chronic and recurrent), hypertension,whopresents to theEmergency Department chief complaint syncope.Pt was admitted with severe sepsis of unclear etiology. CXR upon admission showed no acute disease. Pt was most recently seen for a swallow evaluation during hospitalization at [facility] in August 2018, with recommendations to continue a regular diet and thin liquids. He has been following up with OP SLP for communication, and that SLP has recommended that he continue to perform RMST exercises to maintain swallowing function. 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    Assessment / Plan / Recommendation Clinical Impression  Pt has multiple swallows but no overt coughing with thin liquid trials - he prefers to consume thin liquids today given recent bouts of loose stool. He believes his swallowing function has been stable over the last several months, and his wife initially denied the need for a swallowing test. However, upon further discussion, they share that he has significant coughing/gasping during thin liquid consumption, especially when consuming pills, to the point that his wife chose to step out of the room while he drank water because she did not want to see him struggle. Given their reports, I offered to consider MBS to more closely examine his swallowing function. Pt was agreeable, but his wife and daughter are concerned about his ability to ingest the barium given other comorbidities and had many questions about why this testing should be completed. I answered their questions for now, and also informed them that this test could always be completed on an outpatient basis - either here or with his primary providers at [facility] where they seem to be more comfortable. We left it that we would check back in to see what their decision is. In the meantime, I think it would be fine for pt to continue with his current diet using general aspiration precautions. SLP Visit Diagnosis: Dysphagia, unspecified (R13.10)    Aspiration Risk  Mild aspiration risk;Moderate aspiration risk    Diet Recommendation Regular;Thin liquid   Liquid Administration via: Cup;Straw Medication Administration: Whole meds with liquid  Supervision: Staff to assist with self feeding;Full supervision/cueing for compensatory strategies Compensations: Slow rate;Small sips/bites Postural Changes: Seated upright at 90 degrees    Other  Recommendations Oral Care Recommendations: Oral care BID    Follow up Recommendations Outpatient SLP      Frequency and Duration min 2x/week  1 week       Prognosis        Swallow Study   General HPI: Pt is a 62 y.o.malewith medical history significantfor orthotopic heart transplant 2005(followed by [facility] cardiology), Fahr disease(followed by [facility] neurology),chronic kidney disease, GERD, syncope and collapse(chronic and recurrent), hypertension,whopresents to theEmergency Department chief complaint syncope.Pt was admitted with severe sepsis of unclear etiology. CXR upon admission showed no acute disease. Pt was most recently seen for a swallow evaluation during hospitalization at [facility] in August 2018, with recommendations to continue a regular diet and thin liquids. He has been following up with OP SLP for communication, and that SLP has recommended that he continue to perform RMST exercises to maintain swallowing function. 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  Type of Study: Bedside Swallow Evaluation Previous Swallow Assessment: see HPI Diet Prior to this Study: Regular;Thin liquids Temperature Spikes Noted: Yes(101.7) Respiratory Status: Room air History of Recent Intubation: No Behavior/Cognition: Alert;Cooperative;Pleasant mood;Other (Comment)(uses AAC device) Oral Care Completed by SLP: No Vision: Functional for self-feeding Self-Feeding Abilities: Able to feed self;Able to feed self with adaptive devices Patient Positioning: Upright in bed Baseline Vocal Quality: Normal;Other (comment)(but speech dysarthric)    Oral/Motor/Sensory Function     Ice Chips Ice chips: Not tested   Thin Liquid Thin Liquid: Impaired Presentation: Cup;Self Fed Pharyngeal  Phase Impairments: Multiple swallows    Nectar Thick Nectar Thick Liquid: Not tested   Honey Thick Honey Thick Liquid: Not tested   Puree Puree: Not tested    Solid   GO   Solid: Not tested        Germain Osgood 09/26/2017,3:37 PM  Germain Osgood, M.A. CCC-SLP [phone]

## 2017-09-26 NOTE — Progress Notes (Addendum)
PROGRESS NOTE    Darin Vega  ZOX:[id number] DOB: 09/20/1956 DOA: 09/25/2017 PCP: Leanna Battles, MD     Brief Narrative:  Darin Vega is a 62 y.o. 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 Information is gathered from wife. Reportedly his wife found him on morning of admission, slumped over his walker. He was unresponsive. EMS was called and found his blood pressure was 70 systolic, heart rate 811. He is provided with 1 L IV fluids. He was also noted to be hypoxic and placed on a nonrebreather mask. He got to the emergency department his oxygen saturation level was greater than 90%. He is provided with IV fluids and antibiotics. He was admitted to stepdown unit for presumptive severe sepsis.   Assessment & Plan:   Principal Problem:   Acute encephalopathy Active Problems:   CKD (chronic kidney disease) stage 3, GFR 30-59 ml/min (HCC)   SIRS (systemic inflammatory response syndrome) (HCC)   Acute respiratory failure (HCC)   Elevated lactic acid level   Hypothermia   Tachycardia   Syncope and collapse -Patient has had chronic syncope in the past. Chart review indicates patient has had extensive syncope workup at [facility] by his transplant team which was unrevealing and therefore sent to the syncope clinic where he underwent tilt table test was positive for vasodepressor syncope. Has loop recorder in place  -Troponin negative  -Loop recorder interrogation   Severe sepsis, unclear etiology -On presentation, patient was hypotensive, tachycardic, tachypneic, with hypoxia, elevated lactic acid  -CXR No active disease -AXR Normal bowel gas pattern -C Diff negative -GI panel negative -UA unremarkable -Blood culture pending    -Unclear infectious etiology. 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, speech deficits -Followed by [facility] neurology  Chronic normocytic anemia -Baseline Hgb ~ 12  -Monitor     DVT prophylaxis: lovenox Code Status: DNR Family Communication: wife at bedside Disposition Plan: pending improvement    Consultants:   None  Procedures:   None  Antimicrobials:  Anti-infectives (From admission, onward)   Start     Dose/Rate Route Frequency Ordered Stop   09/26/17 0800  vancomycin (VANCOCIN) IVPB 750 mg/150 ml premix     750 mg 150 mL/hr over 60 Minutes Intravenous Every 24 hours 09/25/17 1123     09/25/17 2130  piperacillin-tazobactam (ZOSYN) IVPB 3.375 g     3.375 g 12.5 mL/hr over 240 Minutes Intravenous Every 8 hours 09/25/17 2102     09/25/17 1800  piperacillin-tazobactam (ZOSYN) IVPB 3.375 g  Status:  Discontinued     3.375 g 12.5 mL/hr over 240 Minutes Intravenous Every 8 hours 09/25/17 1123 09/25/17 2102   09/25/17 1115  piperacillin-tazobactam (ZOSYN) IVPB 3.375 g     3.375 g 100 mL/hr over 30 Minutes Intravenous  Once 09/25/17 1103 09/25/17 1156   09/25/17 1115  vancomycin (VANCOCIN)  IVPB 1000 mg/200 mL premix     1,000 mg 200 mL/hr over 60 Minutes Intravenous  Once 09/25/17 1103 09/25/17 1441        Subjective: Patient is largely nonverbal, but able to communicate yes/no. He allows his wife to give most of history. He denies  recent fevers, chills, chest pain, SOB. He had episodes of coughing the other day, but cleared up with mucinex. Had projectile vomiting and worsening diarrhea in the ED, but not at home. No dysuria.   Objective: Vitals:   09/25/17 2027 09/26/17 0350 09/26/17 0400 09/26/17 0855  BP: (!) 149/83 (!) 139/91 (!) 140/101 (!) 133/94  Pulse: (!) 106 93 93 96  Resp: 20 17 18 18   Temp: 98 F (36.7 C) 97.6 F (36.4 C)  97.9 F (36.6 C)  TempSrc: Oral Oral  Oral  SpO2: 96% 97% 95% 97%  Weight:   69.5 kg (153 lb 4.8 oz)   Height:        Intake/Output Summary (Last 24 hours) at 09/26/2017 1320 Last data filed at 09/26/2017 1028 Gross per 24 hour  Intake 1610 ml  Output 1025 ml  Net 585 ml   Filed Weights   09/25/17 1814 09/26/17 0400  Weight: 69.6 kg (153 lb 7 oz) 69.5 kg (153 lb 4.8 oz)    Examination:  General exam: Appears calm and comfortable  Respiratory system: Clear to auscultation. Respiratory effort normal on room air  Cardiovascular system: S1 & S2 heard, RRR. No JVD, murmurs, rubs, gallops or clicks. No pedal edema. Gastrointestinal system: Abdomen is nondistended, soft and nontender. No organomegaly or masses felt. Normal bowel sounds heard. Central nervous system: Alert and oriented.  Extremities: Symmetric  Skin: No rashes, lesions or ulcers Psychiatry: Judgement and insight appear normal. Mood & affect appropriate.   Data Reviewed: I have personally reviewed following labs and imaging studies  CBC: Recent Labs  Lab 09/25/17 0958 09/26/17 0202  WBC 10.0 9.3  NEUTROABS 5.5  --   HGB 14.0 11.1*  HCT 42.9 35.5*  MCV 92.7 92.9  PLT 229 875   Basic Metabolic Panel: Recent Labs  Lab 09/25/17 0958 09/26/17 0202  NA 140 140  K 3.5 4.1  CL 108 109  CO2 22 24  GLUCOSE 182* 102*  BUN 29* 29*  CREATININE 2.07* 2.06*  CALCIUM 8.7* 8.1*   GFR: Estimated Creatinine Clearance: 36.1 mL/min (A) (by C-G formula based on SCr of 2.06 mg/dL (H)). Liver Function  Tests: Recent Labs  Lab 09/25/17 0958  AST 24  ALT 11*  ALKPHOS 55  BILITOT 1.2  PROT 6.8  ALBUMIN 3.1*   No results for input(s): LIPASE, AMYLASE in the last 168 hours. No results for input(s): AMMONIA in the last 168 hours. Coagulation Profile: Recent Labs  Lab 09/25/17 0958  INR 1.08   Cardiac Enzymes: Recent Labs  Lab 09/25/17 1400 09/25/17 1910 09/26/17 0202  TROPONINI 0.04* <0.03 <0.03   BNP (last 3 results) No results for input(s): PROBNP in the last 8760 hours. HbA1C: No results for input(s): HGBA1C in the last 72 hours. CBG: Recent Labs  Lab 09/25/17 0951  GLUCAP 171*   Lipid Profile: No results for input(s): CHOL, HDL, LDLCALC, TRIG, CHOLHDL, LDLDIRECT in the last 72 hours. Thyroid Function Tests: No results for input(s): TSH, T4TOTAL, FREET4, T3FREE, THYROIDAB in the last 72 hours. Anemia Panel: No results for input(s): VITAMINB12, FOLATE, FERRITIN, TIBC, IRON, RETICCTPCT in the last 72 hours. Sepsis Labs: Recent Labs  Lab 09/25/17 1000  09/25/17 1232  LATICACIDVEN 3.99* 2.12*    Recent Results (from the past 240 hour(s))  Culture, blood (Routine x 2)     Status: None (Preliminary result)   Collection Time: 09/25/17  9:58 AM  Result Value Ref Range Status   Specimen Description BLOOD LEFT ANTECUBITAL  Final   Special Requests   Final    BOTTLES DRAWN AEROBIC AND ANAEROBIC Blood Culture adequate volume   Culture NO GROWTH 1 DAY  Final   Report Status PENDING  Incomplete  Culture, blood (Routine x 2)     Status: None (Preliminary result)   Collection Time: 09/25/17 11:17 AM  Result Value Ref Range Status   Specimen Description BLOOD RIGHT FOREARM  Final   Special Requests   Final    BOTTLES DRAWN AEROBIC AND ANAEROBIC Blood Culture adequate volume   Culture NO GROWTH < 24 HOURS  Final   Report Status PENDING  Incomplete  Gastrointestinal Panel by PCR , Stool     Status: None   Collection Time: 09/25/17 11:52 AM  Result Value Ref Range  Status   Campylobacter species NOT DETECTED NOT DETECTED Final   Plesimonas shigelloides NOT DETECTED NOT DETECTED Final   Salmonella species NOT DETECTED NOT DETECTED Final   Yersinia enterocolitica NOT DETECTED NOT DETECTED Final   Vibrio species NOT DETECTED NOT DETECTED Final   Vibrio cholerae NOT DETECTED NOT DETECTED Final   Enteroaggregative E coli (EAEC) NOT DETECTED NOT DETECTED Final   Enteropathogenic E coli (EPEC) NOT DETECTED NOT DETECTED Final   Enterotoxigenic E coli (ETEC) NOT DETECTED NOT DETECTED Final   Shiga like toxin producing E coli (STEC) NOT DETECTED NOT DETECTED Final   Shigella/Enteroinvasive E coli (EIEC) NOT DETECTED NOT DETECTED Final   Cryptosporidium NOT DETECTED NOT DETECTED Final   Cyclospora cayetanensis NOT DETECTED NOT DETECTED Final   Entamoeba histolytica NOT DETECTED NOT DETECTED Final   Giardia lamblia NOT DETECTED NOT DETECTED Final   Adenovirus F40/41 NOT DETECTED NOT DETECTED Final   Astrovirus NOT DETECTED NOT DETECTED Final   Norovirus GI/GII NOT DETECTED NOT DETECTED Final   Rotavirus A NOT DETECTED NOT DETECTED Final   Sapovirus (I, II, IV, and V) NOT DETECTED NOT DETECTED Final    Comment: Performed at [facility], [address] [place], [place] [address]  C difficile quick scan w PCR reflex     Status: None   Collection Time: 09/25/17 11:52 AM  Result Value Ref Range Status   C Diff antigen NEGATIVE NEGATIVE Final   C Diff toxin NEGATIVE NEGATIVE Final   C Diff interpretation No C. difficile detected.  Final       Radiology Studies: Ct Head Wo Contrast  Result Date: 09/25/2017 CLINICAL DATA:  Patient was found slumped over walker earlier this morning, unresponsive. EXAM: CT HEAD WITHOUT CONTRAST TECHNIQUE: Contiguous axial images were obtained from the base of the skull through the vertex without intravenous contrast. COMPARISON:  Multiple priors, most recent 04/08/2017. FINDINGS: Brain: Extensive brain parenchymal  calcification of a symmetric nature, consistent with Fahr's disease. This involves the supratentorial white matter, deep nuclei, brainstem, and cerebellum. An acute area of infarction or hemorrhage is not identified. Vascular: No hyperdense vessel. Skull: Calvarium intact. Sinuses/Orbits: No layering fluid. Other: Compared with priors, a similar appearance is noted. IMPRESSION: Extensive brain parenchymal calcification consistent with Fahr's disease. No acute findings are evident. Electronically Signed   By: Staci Righter M.D.   On: 09/25/2017 13:47   Dg Chest Portable  1 View  Result Date: 09/25/2017 CLINICAL DATA:  62 year old male with altered mental status EXAM: PORTABLE CHEST 1 VIEW COMPARISON:  Prior chest x-ray and chest CT 03/29/2016 FINDINGS: Stable cardiac and mediastinal contours. Implantable loop recorder projects over the left chest. Epicardial pacing leads are also visible. The lungs are clear. No focal airspace consolidation visualized. Chronic atelectasis in the inferior lingula and left lower lobe. Stable chronic bronchitic changes. No pneumothorax or pleural effusion. No acute osseous abnormality. IMPRESSION: No active disease. Electronically Signed   By: Jacqulynn Cadet M.D.   On: 09/25/2017 10:34   Dg Abd 2 Views  Result Date: 09/25/2017 CLINICAL DATA:  Found this morning slumped over his walker and unresponsive, abdominal pain and watery stools, history CHF, heart transplant, Fahr syndrome EXAM: ABDOMEN - 2 VIEW COMPARISON:  None FINDINGS: Epicardial pacing wires and loop recorder project over heart. Lung bases clear. Normal bowel gas pattern. No bowel dilatation or bowel wall thickening. No free air. Nonobstructing RIGHT renal calculus 8 x 5 mm diameter. No acute osseous findings. IMPRESSION: Nonobstructing 8 x 5 mm RIGHT renal calculus. Normal bowel gas pattern Electronically Signed   By: Lavonia Dana M.D.   On: 09/25/2017 13:47      Scheduled Meds: . carvedilol  3.125 mg Oral BID   . enoxaparin (LOVENOX) injection  40 mg Subcutaneous Q24H  . loratadine  10 mg Oral Q lunch  . pantoprazole  40 mg Oral Daily  . pravastatin  40 mg Oral QHS  . scopolamine  1 patch Transdermal Q72H  . sodium chloride  1 spray Each Nare BID  . tacrolimus  1 mg Oral BID   Continuous Infusions: . sodium chloride 75 mL/hr at 09/26/17 0500  . piperacillin-tazobactam (ZOSYN)  IV Stopped (09/26/17 0921)  . sodium chloride    . vancomycin Stopped (09/26/17 0931)     LOS: 1 day    Time spent: 75 minutes  Spent 40 minutes face-to-face with patient and wife at bedside. 8:10am-8:50am   Dessa Phi, DO [facility] [email or web address] Password [email or web address] 09/26/2017, 1:20 PM

## 2017-09-26 NOTE — Progress Notes (Signed)
   09/26/17 1100  Clinical Encounter Type  Visited With Patient and family together;Health care provider  Visit Type Initial  Referral From Nurse  Consult/Referral To Chaplain  Spiritual Encounters  Spiritual Needs Emotional;Prayer  Stress Factors  Family Stress Factors Major life changes;Exhausted   Responded to a [facility] for Palliative Care.  Patient was in the bed with his wife by his side.  She has been his primary care giver for the last 19 years.  Patient has had a number of significant health issues over these years.  Wife shared with me most of what has been going on.  I provided an outlet for her to share as she is tired and frustrated and I assessed afraid of what will happen.  They are supported by a faith community.  They have one daughter who has a very young child, so the care is solo on the wife.  Apparently they had been part of [place], but not now.  They wanted some questions answered so I spoke with the RN for this patient and she was going to find out about the Hospice Social Worker stopping by.  Will follow as needed. Chaplain Katherene Ponto

## 2017-09-27 ENCOUNTER — Encounter (HOSPITAL_COMMUNITY): Payer: Self-pay | Admitting: General Practice

## 2017-09-27 ENCOUNTER — Other Ambulatory Visit: Payer: Self-pay

## 2017-09-27 LAB — BASIC METABOLIC PANEL
Anion gap: 11 (ref 5–15)
BUN: 19 mg/dL (ref 6–20)
CALCIUM: 8.6 mg/dL — AB (ref 8.9–10.3)
CO2: 22 mmol/L (ref 22–32)
CREATININE: 2.01 mg/dL — AB (ref 0.61–1.24)
Chloride: 107 mmol/L (ref 101–111)
GFR calc Af Amer: 39 mL/min — ABNORMAL LOW (ref >60)
GFR, EST NON AFRICAN AMERICAN: 34 mL/min — AB (ref >60)
GLUCOSE: 88 mg/dL (ref 65–99)
Potassium: 3.4 mmol/L — ABNORMAL LOW (ref 3.5–5.1)
Sodium: 140 mmol/L (ref 135–145)

## 2017-09-27 LAB — RESPIRATORY PANEL BY PCR
ADENOVIRUS-RVPPCR: NOT DETECTED
Bordetella pertussis: NOT DETECTED
CORONAVIRUS HKU1-RVPPCR: NOT DETECTED
CORONAVIRUS NL63-RVPPCR: NOT DETECTED
CORONAVIRUS OC43-RVPPCR: NOT DETECTED
Chlamydophila pneumoniae: NOT DETECTED
Coronavirus 229E: NOT DETECTED
INFLUENZA A-RVPPCR: NOT DETECTED
Influenza B: NOT DETECTED
METAPNEUMOVIRUS-RVPPCR: NOT DETECTED
Mycoplasma pneumoniae: NOT DETECTED
PARAINFLUENZA VIRUS 2-RVPPCR: NOT DETECTED
PARAINFLUENZA VIRUS 4-RVPPCR: NOT DETECTED
Parainfluenza Virus 1: NOT DETECTED
Parainfluenza Virus 3: NOT DETECTED
RHINOVIRUS / ENTEROVIRUS - RVPPCR: NOT DETECTED
Respiratory Syncytial Virus: NOT DETECTED

## 2017-09-27 LAB — CBC
HCT: 34.6 % — ABNORMAL LOW (ref 39.0–52.0)
Hemoglobin: 11.1 g/dL — ABNORMAL LOW (ref 13.0–17.0)
MCH: 29.9 pg (ref 26.0–34.0)
MCHC: 32.1 g/dL (ref 30.0–36.0)
MCV: 93.3 fL (ref 78.0–100.0)
Platelets: 152 10*3/uL (ref 150–400)
RBC: 3.71 MIL/uL — ABNORMAL LOW (ref 4.22–5.81)
RDW: 13.5 % (ref 11.5–15.5)
WBC: 6.9 10*3/uL (ref 4.0–10.5)

## 2017-09-27 MED ORDER — ENSURE ENLIVE PO LIQD
237.0000 mL | Freq: Two times a day (BID) | ORAL | Status: DC
  Administered 2017-09-28: 237 mL via ORAL

## 2017-09-27 MED ORDER — POTASSIUM CHLORIDE CRYS ER 20 MEQ PO TBCR
40.0000 meq | EXTENDED_RELEASE_TABLET | Freq: Once | ORAL | Status: AC
  Administered 2017-09-27: 40 meq via ORAL
  Filled 2017-09-27: qty 2

## 2017-09-27 MED ORDER — AMLODIPINE BESYLATE 5 MG PO TABS
2.5000 mg | ORAL_TABLET | Freq: Every day | ORAL | Status: DC
  Administered 2017-09-27 – 2017-09-30 (×4): 2.5 mg via ORAL
  Filled 2017-09-27 (×4): qty 1

## 2017-09-27 NOTE — Care Management Note (Signed)
Case Management Note Marvetta Gibbons RN, BSN Unit 4E-Case Manager [phone]  Patient Details  Name: Darin Vega MRN: [id number] Date of Birth: 01-Nov-1955  Subjective/Objective:  Pt admitted with acute encephalopathy ?sepsis BC pending               Action/Plan: PTA pt was living at home with wife- uses walker and has frequent falls - needs assistance with ADLs- pt with hx of fahr disease which is neurodegenerative condition which has features of Parkinsonism.  Spoke with pt and wife at bedside for ?home hospice needs- per conversation pt was with [facility] for home hospice services until Oct. Of 2018 when services were revoked due to non coverage under hospice for pt's Lingraphica speech assist device that he has as a trial through [employer]. Pt is hopeful for a permanent device and has an appointment on Jan 22 regarding this. Pt has been doing PT/OT/ST as an outpt at one point last year and was to have f/u with PC through [facility] but wife states that they have not heard from anyone- pt and wife are interested in other Hospice Agencies that may can offer needed services if they will cover the speech device that pt needs- Wife is overwhelmed with pt's care at home- and states she can not continue to care for him with things like they are- discussed options such as private duty however wife reports this in not an option financially for them. Discussed STSNF vs Long term care- they are also interested in speaking to [facility] while they are here for [place]. Will provide pt/wife with list of other Hospice providers. Pt could also benefit from PT/OT evals to assist with recommendations for safe transition.     Expected Discharge Date:                  Expected Discharge Plan:     In-House Referral:     Discharge planning Services  CM Consult  Post Acute Care Choice:    Choice offered to:     DME Arranged:    DME Agency:     HH Arranged:    HH Agency:     Status of Service:  In process, will continue to  follow  If discussed at Long Length of Stay Meetings, dates discussed:    Discharge Disposition:   Additional Comments:  Dawayne Patricia, RN 09/27/2017, 12:09 PM

## 2017-09-27 NOTE — Evaluation (Signed)
Physical Therapy Evaluation Patient Details Name: Darin Vega MRN: [id number] DOB: 10-13-1955 Today's Date: 09/27/2017   History of Present Illness  62yo male admitted following syncope, found to be hypothermic, hypotensive, tachycardic, and hypoxic in the ED. Note significant history of Fahr's syndome, recent diagnosis of vasodepressor syndrome, and history of syncope following heart transplant. PMH heart transplant 2005, Fahr's neurodegenerative syndrome, HTN, MS, renal disorder   Clinical Impression   Patient received in bed, pleasant and willing to work with skilled PT services; note wife is very anxious and requiring repeated and multiple bouts of reassurance and education throughout session. Patient able to mobilize to EOB with Mod assist, able to perform functional transfers and ambulate in room with min guard, slow and steady and with VC for safety. Patient may benefit from short term rehab in SNF setting to address functional deficits especially given relatively high fall risk given complex medical history and multiple recent falls due to his medical condition. RN educated on patient performance with PT and wife's anxiety/recommended chaplain however RN advises that chaplain is already being utilized. Patient left in bed with all needs met, bed alarm activated this afternoon.      Follow Up Recommendations SNF    Equipment Recommendations  None recommended by PT    Recommendations for Other Services OT consult     Precautions / Restrictions Precautions Precautions: Fall Precaution Comments: hx of heart transplant, neurodegenerative disease, frequent falls  Restrictions Weight Bearing Restrictions: No      Mobility  Bed Mobility Overal bed mobility: Needs Assistance Bed Mobility: Sit to Supine;Supine to Sit     Supine to sit: Mod assist Sit to supine: Min guard   General bed mobility comments: Mod assist to bring legs around to EOB, min guard with return to bed for  line management   Transfers Overall transfer level: Needs assistance Equipment used: 4-wheeled walker Transfers: Sit to/from Stand;Stand Pivot Transfers Sit to Stand: Min guard Stand pivot transfers: Min guard       General transfer comment: VC for sequencing and safety   Ambulation/Gait Ambulation/Gait assistance: Min guard Ambulation Distance (Feet): 8 Feet(forward and backward ) Assistive device: 4-wheeled walker Gait Pattern/deviations: Step-through pattern;Decreased step length - right;Decreased step length - left;Decreased stride length;Decreased dorsiflexion - right;Decreased dorsiflexion - left;Drifts right/left;Trunk flexed;Narrow base of support     General Gait Details: no signfiicant festinating pattern noted with gait today, patient requires min guard for safety but appears slow but steady with walker   Stairs            Wheelchair Mobility    Modified Rankin (Stroke Patients Only)       Balance Overall balance assessment: Needs assistance Sitting-balance support: Feet supported;Bilateral upper extremity supported Sitting balance-Leahy Scale: Good     Standing balance support: Bilateral upper extremity supported;During functional activity Standing balance-Leahy Scale: Fair                               Pertinent Vitals/Pain Pain Assessment: Faces Faces Pain Scale: No hurt Pain Intervention(s): Monitored during session    Home Living Family/patient expects to be discharged to:: Unsure Living Arrangements: Spouse/significant other               Additional Comments: wife is very anxious and states "if the recommendation is for him to return home, I will refuse it"     Prior Function Level of Independence: Independent with assistive device(s)  Comments: history of frequent falls      Hand Dominance   Dominant Hand: Right    Extremity/Trunk Assessment   Upper Extremity Assessment Upper Extremity Assessment:  Defer to OT evaluation    Lower Extremity Assessment Lower Extremity Assessment: Generalized weakness    Cervical / Trunk Assessment Cervical / Trunk Assessment: Kyphotic  Communication   Communication: No difficulties  Cognition Arousal/Alertness: Awake/alert Behavior During Therapy: WFL for tasks assessed/performed;Flat affect Overall Cognitive Status: History of cognitive impairments - at baseline                                        General Comments General comments (skin integrity, edema, etc.): note history of sudden syncope     Exercises     Assessment/Plan    PT Assessment Patient needs continued PT services  PT Problem List Decreased strength;Decreased mobility;Decreased coordination;Decreased activity tolerance;Decreased balance;Decreased safety awareness;Decreased knowledge of use of DME       PT Treatment Interventions DME instruction;Therapeutic activities;Gait training;Therapeutic exercise;Patient/family education;Stair training;Balance training;Functional mobility training;Neuromuscular re-education    PT Goals (Current goals can be found in the Care Plan section)  Acute Rehab PT Goals Patient Stated Goal: to get better  PT Goal Formulation: With patient/family Time For Goal Achievement: 10/11/17 Potential to Achieve Goals: Good    Frequency Min 3X/week   Barriers to discharge        Co-evaluation               AM-PAC PT "6 Clicks" Daily Activity  Outcome Measure Difficulty turning over in bed (including adjusting bedclothes, sheets and blankets)?: Unable Difficulty moving from lying on back to sitting on the side of the bed? : Unable Difficulty sitting down on and standing up from a chair with arms (e.g., wheelchair, bedside commode, etc,.)?: Unable Help needed moving to and from a bed to chair (including a wheelchair)?: A Little Help needed walking in hospital room?: A Little Help needed climbing 3-5 steps with a railing?  : A Lot 6 Click Score: 11    End of Session Equipment Utilized During Treatment: Gait belt Activity Tolerance: Patient tolerated treatment well Patient left: in bed;with bed alarm set;with family/visitor present Nurse Communication: Mobility status;Other (comment)(high levels of anxiety from wife ) PT Visit Diagnosis: Unsteadiness on feet (R26.81);Muscle weakness (generalized) (M62.81);Other abnormalities of gait and mobility (R26.89);History of falling (Z91.81);Other symptoms and signs involving the nervous system (R29.898)    Time: 9407-6808 PT Time Calculation (min) (ACUTE ONLY): 38 min   Charges:   PT Evaluation $PT Eval High Complexity: 1 High PT Treatments $Therapeutic Activity: 8-22 mins $Self Care/Home Management: 8-22   PT G Codes:   PT G-Codes **NOT FOR INPATIENT CLASS** Functional Assessment Tool Used: AM-PAC 6 Clicks Basic Mobility;Clinical judgement    Deniece Ree PT, DPT, CBIS  Supplemental Physical Therapist [place]   Pager [phone]

## 2017-09-27 NOTE — Progress Notes (Addendum)
PROGRESS NOTE    Darin Vega  RKY:[id number] DOB: [birth year] DOA: 09/25/2017 PCP: Leanna Battles, MD     Brief Narrative:  Darin Vega is a 62 y.o. male with medical history significant for orthotopic heart transplant 2005 (followed by [facility] cardiology), Fahr disease with progressive neurologic decline (followed by [facility] neurology), chronic kidney disease 3, GERD, syncope and collapse (chronic and recurrent), hypertension, who presents to the Emergency Department chief complaint syncope. Information is gathered from wife. Reportedly his wife found him on morning of admission, slumped over his walker. He was unresponsive. EMS was called and found his blood pressure was 70 systolic, heart rate 315. He is provided with 1 L IV fluids. He was also noted to be hypoxic and placed on a nonrebreather mask. He got to the emergency department and his oxygen saturation level was greater than 90%. He was provided with IV fluids and antibiotics and admitted to stepdown unit for presumptive severe sepsis.   Assessment & Plan:   Principal Problem:   Acute encephalopathy Active Problems:   CKD (chronic kidney disease) stage 3, GFR 30-59 ml/min (HCC)   SIRS (systemic inflammatory response syndrome) (HCC)   Acute respiratory failure (HCC)   Elevated lactic acid level   Hypothermia   Tachycardia   Syncope and collapse -Patient has had chronic syncope in the past. Chart review indicates patient has had extensive syncope workup at [facility] by his transplant team which was unrevealing and therefore sent to the syncope clinic where he underwent tilt table test was positive for vasodepressor syncope. Has loop recorder in place  -Troponin negative  -Loop recorder interrogation reviewed in shadow chart. No events to correlate with events of admission   Severe sepsis, unclear etiology -On presentation, patient was hypotensive, tachycardic, tachypneic, with hypoxia, elevated lactic acid  -CXR No active  disease -AXR Normal bowel gas pattern -C Diff negative -GI panel negative -UA unremarkable -Blood culture negative 1 day  -Check respiratory PCR - pending  -Unclear infectious etiology. As patient is immunocompromised, will continue broad spectrum IV antibiotics until clinically stable, afebrile, culture results available. This could be a viral in etiology, patient remains stable currently. Stop vanco today.   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 SW consulted for assistance in helping finding hospice agency who could provide necessary resources. Patient had been enrolled with hospice but revoked it when they would not provide coverage for speech assistive device. Family is still interested in hospice services but want speech assistive device for quality of life.    Consultants:   None  Procedures:   None  Antimicrobials:  Anti-infectives (From admission, onward)   Start     Dose/Rate Route Frequency Ordered Stop   09/26/17 0800  vancomycin (VANCOCIN) IVPB 750 mg/150 ml premix     750 mg 150 mL/hr over 60 Minutes Intravenous Every 24 hours 09/25/17 1123     09/25/17 2130  piperacillin-tazobactam (ZOSYN) IVPB 3.375 g  3.375 g 12.5 mL/hr over 240 Minutes Intravenous Every 8 hours 09/25/17 2102     09/25/17 1800  piperacillin-tazobactam (ZOSYN) IVPB 3.375 g   Status:  Discontinued     3.375 g 12.5 mL/hr over 240 Minutes Intravenous Every 8 hours 09/25/17 1123 09/25/17 2102   09/25/17 1115  piperacillin-tazobactam (ZOSYN) IVPB 3.375 g     3.375 g 100 mL/hr over 30 Minutes Intravenous  Once 09/25/17 1103 09/25/17 1156   09/25/17 1115  vancomycin (VANCOCIN) IVPB 1000 mg/200 mL premix     1,000 mg 200 mL/hr over 60 Minutes Intravenous  Once 09/25/17 1103 09/25/17 1441       Subjective: Feeling well this morning. Continues to have diarrhea which is chronic in nature but has been slowly worsening in the past months. No nausea, vomiting. Tolerated breakfast. No CP SOB. Has some lower abdominal cramping that is intermittent, but none right now.   Objective: Vitals:   09/26/17 2136 09/26/17 2320 09/27/17 0400 09/27/17 0505  BP: (!) 137/91 124/89 (!) 135/93   Pulse: 97 91 87   Resp:      Temp: 98.1 F (36.7 C) 98.2 F (36.8 C) 98.1 F (36.7 C) 98.1 F (36.7 C)  TempSrc: Oral Oral Oral   SpO2: 97% 96% 98%   Weight:      Height:        Intake/Output Summary (Last 24 hours) at 09/27/2017 0833 Last data filed at 09/27/2017 0500 Gross per 24 hour  Intake 660 ml  Output 3050 ml  Net -2390 ml   Filed Weights   09/25/17 1814 09/26/17 0400  Weight: 69.6 kg (153 lb 7 oz) 69.5 kg (153 lb 4.8 oz)    Examination:  General exam: Appears calm and comfortable  Respiratory system: Clear to auscultation. Respiratory effort normal on room air  Cardiovascular system: S1 & S2 heard, tachycardic, regular rhythm. No JVD, murmurs, rubs, gallops or clicks. No pedal edema. Gastrointestinal system: Abdomen is nondistended, soft and nontender. No organomegaly or masses felt. Normal bowel sounds heard. Central nervous system: Alert and oriented. Speech deficit consistent with his progressive neurologic disease  Extremities: Symmetric  Skin: No rashes, lesions or ulcers Psychiatry: Judgement and insight appear normal. Mood & affect appropriate.   Data  Reviewed: I have personally reviewed following labs and imaging studies  CBC: Recent Labs  Lab 09/25/17 0958 09/26/17 0202 09/27/17 0342  WBC 10.0 9.3 6.9  NEUTROABS 5.5  --   --   HGB 14.0 11.1* 11.1*  HCT 42.9 35.5* 34.6*  MCV 92.7 92.9 93.3  PLT 229 157 102   Basic Metabolic Panel: Recent Labs  Lab 09/25/17 0958 09/26/17 0202 09/27/17 0342  NA 140 140 140  K 3.5 4.1 3.4*  CL 108 109 107  CO2 22 24 22   GLUCOSE 182* 102* 88  BUN 29* 29* 19  CREATININE 2.07* 2.06* 2.01*  CALCIUM 8.7* 8.1* 8.6*   GFR: Estimated Creatinine Clearance: 37 mL/min (A) (by C-G formula based on SCr of 2.01 mg/dL (H)). Liver Function Tests: Recent Labs  Lab 09/25/17 0958  AST 24  ALT 11*  ALKPHOS 55  BILITOT 1.2  PROT 6.8  ALBUMIN 3.1*   No results for input(s): LIPASE, AMYLASE in the last 168 hours. No results for input(s): AMMONIA in the last 168 hours. Coagulation Profile: Recent Labs  Lab 09/25/17 0958  INR 1.08   Cardiac Enzymes: Recent Labs  Lab 09/25/17 1400 09/25/17 1910 09/26/17 0202  TROPONINI 0.04* <0.03 <0.03   BNP (  last 3 results) No results for input(s): PROBNP in the last 8760 hours. HbA1C: No results for input(s): HGBA1C in the last 72 hours. CBG: Recent Labs  Lab 09/25/17 0951  GLUCAP 171*   Lipid Profile: No results for input(s): CHOL, HDL, LDLCALC, TRIG, CHOLHDL, LDLDIRECT in the last 72 hours. Thyroid Function Tests: No results for input(s): TSH, T4TOTAL, FREET4, T3FREE, THYROIDAB in the last 72 hours. Anemia Panel: No results for input(s): VITAMINB12, FOLATE, FERRITIN, TIBC, IRON, RETICCTPCT in the last 72 hours. Sepsis Labs: Recent Labs  Lab 09/25/17 1000 09/25/17 1232  LATICACIDVEN 3.99* 2.12*    Recent Results (from the past 240 hour(s))  Culture, blood (Routine x 2)     Status: None (Preliminary result)   Collection Time: 09/25/17  9:58 AM  Result Value Ref Range Status   Specimen Description BLOOD LEFT ANTECUBITAL  Final    Special Requests   Final    BOTTLES DRAWN AEROBIC AND ANAEROBIC Blood Culture adequate volume   Culture NO GROWTH 1 DAY  Final   Report Status PENDING  Incomplete  Culture, blood (Routine x 2)     Status: None (Preliminary result)   Collection Time: 09/25/17 11:17 AM  Result Value Ref Range Status   Specimen Description BLOOD RIGHT FOREARM  Final   Special Requests   Final    BOTTLES DRAWN AEROBIC AND ANAEROBIC Blood Culture adequate volume   Culture NO GROWTH 1 DAY  Final   Report Status PENDING  Incomplete  Gastrointestinal Panel by PCR , Stool     Status: None   Collection Time: 09/25/17 11:52 AM  Result Value Ref Range Status   Campylobacter species NOT DETECTED NOT DETECTED Final   Plesimonas shigelloides NOT DETECTED NOT DETECTED Final   Salmonella species NOT DETECTED NOT DETECTED Final   Yersinia enterocolitica NOT DETECTED NOT DETECTED Final   Vibrio species NOT DETECTED NOT DETECTED Final   Vibrio cholerae NOT DETECTED NOT DETECTED Final   Enteroaggregative E coli (EAEC) NOT DETECTED NOT DETECTED Final   Enteropathogenic E coli (EPEC) NOT DETECTED NOT DETECTED Final   Enterotoxigenic E coli (ETEC) NOT DETECTED NOT DETECTED Final   Shiga like toxin producing E coli (STEC) NOT DETECTED NOT DETECTED Final   Shigella/Enteroinvasive E coli (EIEC) NOT DETECTED NOT DETECTED Final   Cryptosporidium NOT DETECTED NOT DETECTED Final   Cyclospora cayetanensis NOT DETECTED NOT DETECTED Final   Entamoeba histolytica NOT DETECTED NOT DETECTED Final   Giardia lamblia NOT DETECTED NOT DETECTED Final   Adenovirus F40/41 NOT DETECTED NOT DETECTED Final   Astrovirus NOT DETECTED NOT DETECTED Final   Norovirus GI/GII NOT DETECTED NOT DETECTED Final   Rotavirus A NOT DETECTED NOT DETECTED Final   Sapovirus (I, II, IV, and V) NOT DETECTED NOT DETECTED Final    Comment: Performed at [facility], [address] [place],  [address]  C difficile quick scan w PCR reflex      Status: None   Collection Time: 09/25/17 11:52 AM  Result Value Ref Range Status   C Diff antigen NEGATIVE NEGATIVE Final   C Diff toxin NEGATIVE NEGATIVE Final   C Diff interpretation No C. difficile detected.  Final       Radiology Studies: Ct Head Wo Contrast  Result Date: 09/25/2017 CLINICAL DATA:  Patient was found slumped over walker earlier this morning, unresponsive. EXAM: CT HEAD WITHOUT CONTRAST TECHNIQUE: Contiguous axial images were obtained from the base of the skull through the vertex without intravenous contrast. COMPARISON:  Multiple priors, most recent 04/08/2017. FINDINGS: Brain: Extensive brain parenchymal calcification of a symmetric nature, consistent with Fahr's disease. This involves the supratentorial white matter, deep nuclei, brainstem, and cerebellum. An acute area of infarction or hemorrhage is not identified. Vascular: No hyperdense vessel. Skull: Calvarium intact. Sinuses/Orbits: No layering fluid. Other: Compared with priors, a similar appearance is noted. IMPRESSION: Extensive brain parenchymal calcification consistent with Fahr's disease. No acute findings are evident. Electronically Signed   By: Staci Righter M.D.   On: 09/25/2017 13:47   Dg Chest Portable 1 View  Result Date: 09/25/2017 CLINICAL DATA:  62 year old male with altered mental status EXAM: PORTABLE CHEST 1 VIEW COMPARISON:  Prior chest x-ray and chest CT 03/29/2016 FINDINGS: Stable cardiac and mediastinal contours. Implantable loop recorder projects over the left chest. Epicardial pacing leads are also visible. The lungs are clear. No focal airspace consolidation visualized. Chronic atelectasis in the inferior lingula and left lower lobe. Stable chronic bronchitic changes. No pneumothorax or pleural effusion. No acute osseous abnormality. IMPRESSION: No active disease. Electronically Signed   By: Jacqulynn Cadet M.D.   On: 09/25/2017 10:34   Dg Abd 2 Views  Result Date: 09/25/2017 CLINICAL DATA:   Found this morning slumped over his walker and unresponsive, abdominal pain and watery stools, history CHF, heart transplant, Fahr syndrome EXAM: ABDOMEN - 2 VIEW COMPARISON:  None FINDINGS: Epicardial pacing wires and loop recorder project over heart. Lung bases clear. Normal bowel gas pattern. No bowel dilatation or bowel wall thickening. No free air. Nonobstructing RIGHT renal calculus 8 x 5 mm diameter. No acute osseous findings. IMPRESSION: Nonobstructing 8 x 5 mm RIGHT renal calculus. Normal bowel gas pattern Electronically Signed   By: Lavonia Dana M.D.   On: 09/25/2017 13:47      Scheduled Meds: . aspirin EC  81 mg Oral Daily  . carvedilol  3.125 mg Oral BID  . cholecalciferol  1,000 Units Oral Daily  . enoxaparin (LOVENOX) injection  40 mg Subcutaneous Q24H  . loratadine  10 mg Oral Q lunch  . mouth rinse  15 mL Mouth Rinse BID  . pantoprazole  40 mg Oral Daily  . potassium chloride  40 mEq Oral Once  . pravastatin  40 mg Oral QHS  . scopolamine  1 patch Transdermal Q72H  . sodium chloride  1 spray Each Nare BID  . tacrolimus  1 mg Oral BID  . triamcinolone  2 spray Nasal Daily  . vitamin C  500 mg Oral BID   Continuous Infusions: . piperacillin-tazobactam (ZOSYN)  IV 3.375 g (09/27/17 0610)  . sodium chloride    . vancomycin Stopped (09/26/17 0931)     LOS: 2 days    Time spent: 30 minutes   Dessa Phi, DO [facility] [email or web address] Password [facility] 09/27/2017, 8:33 AM

## 2017-09-27 NOTE — Progress Notes (Addendum)
  Speech Language Pathology Treatment: Dysphagia  Patient Details Name: Darin Vega MRN: [id number] DOB: 18-Feb-1956 Today's Date: 09/27/2017 Time: 4709-2957 SLP Time Calculation (min) (ACUTE ONLY): 14 min  Assessment / Plan / Recommendation Clinical Impression  Therapist reviewed notes from yesterday and spoke with wife and pt re: decision/desire for MBS. Wife very pleasant but reports she wishes to defer MBS "with everything else going on that seems to be higher priority." Pt appears to be in agreement. SLP educated re: outpatient MBS option if they change their minds and precautions to mitigate risk. Observed with several sips thin water via cup with subtle and delayed throat clear. Will sign off at this time. Continue regular/thin liquids.    HPI HPI: Pt is a 62 y.o.malewith medical history significantfor orthotopic heart transplant 2005(followed by [facility] cardiology), Fahr disease(followed by [facility] neurology),chronic kidney disease, GERD, syncope and collapse(chronic and recurrent), hypertension,whopresents to theEmergency Department chief complaint syncope.Pt was admitted with severe sepsis of unclear etiology. CXR upon admission showed no acute disease. Pt was most recently seen for a swallow evaluation during hospitalization at [facility] in August 2018, with recommendations to continue a regular diet and thin liquids. He has been following up with OP SLP for communication, and that SLP has recommended that he continue to perform RMST exercises to maintain swallowing function. 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       SLP Plan  All goals met       Recommendations  Diet recommendations: Regular;Thin liquid Liquids provided via: Cup Medication Administration: Whole meds with liquid(or whole in applesauce) Supervision: Patient able to self feed Compensations: Slow rate;Small  sips/bites Postural Changes and/or Swallow Maneuvers: Seated upright 90 degrees                Oral Care Recommendations: Oral care BID Follow up Recommendations: Outpatient SLP SLP Visit Diagnosis: Dysphagia, unspecified (R13.10) Plan: All goals met       GO                Houston Siren 09/27/2017, 9:44 AM  Orbie Pyo Colvin Caroli.Ed [employer] [phone]

## 2017-09-28 DIAGNOSIS — R6510 Systemic inflammatory response syndrome (SIRS) of non-infectious origin without acute organ dysfunction: Secondary | ICD-10-CM

## 2017-09-28 DIAGNOSIS — I10 Essential (primary) hypertension: Secondary | ICD-10-CM

## 2017-09-28 DIAGNOSIS — J9601 Acute respiratory failure with hypoxia: Secondary | ICD-10-CM

## 2017-09-28 DIAGNOSIS — N183 Chronic kidney disease, stage 3 (moderate): Secondary | ICD-10-CM

## 2017-09-28 DIAGNOSIS — R197 Diarrhea, unspecified: Secondary | ICD-10-CM

## 2017-09-28 LAB — BASIC METABOLIC PANEL
Anion gap: 8 (ref 5–15)
BUN: 18 mg/dL (ref 6–20)
CALCIUM: 8.8 mg/dL — AB (ref 8.9–10.3)
CO2: 25 mmol/L (ref 22–32)
CREATININE: 2.01 mg/dL — AB (ref 0.61–1.24)
Chloride: 107 mmol/L (ref 101–111)
GFR calc Af Amer: 39 mL/min — ABNORMAL LOW (ref >60)
GFR, EST NON AFRICAN AMERICAN: 34 mL/min — AB (ref >60)
GLUCOSE: 93 mg/dL (ref 65–99)
Potassium: 3.6 mmol/L (ref 3.5–5.1)
SODIUM: 140 mmol/L (ref 135–145)

## 2017-09-28 LAB — CBC
HEMATOCRIT: 35.7 % — AB (ref 39.0–52.0)
Hemoglobin: 11.3 g/dL — ABNORMAL LOW (ref 13.0–17.0)
MCH: 29.1 pg (ref 26.0–34.0)
MCHC: 31.7 g/dL (ref 30.0–36.0)
MCV: 92 fL (ref 78.0–100.0)
PLATELETS: 158 10*3/uL (ref 150–400)
RBC: 3.88 MIL/uL — ABNORMAL LOW (ref 4.22–5.81)
RDW: 13 % (ref 11.5–15.5)
WBC: 7.1 10*3/uL (ref 4.0–10.5)

## 2017-09-28 LAB — MAGNESIUM: Magnesium: 1.4 mg/dL — ABNORMAL LOW (ref 1.7–2.4)

## 2017-09-28 MED ORDER — MAGNESIUM SULFATE 2 GM/50ML IV SOLN
2.0000 g | Freq: Once | INTRAVENOUS | Status: AC
  Administered 2017-09-28: 2 g via INTRAVENOUS
  Filled 2017-09-28: qty 50

## 2017-09-28 NOTE — Progress Notes (Signed)
Initial Nutrition Assessment  DOCUMENTATION CODES:   Not applicable  INTERVENTION:    Magic cup TID with meals, each supplement provides 290 kcal and 9 grams of protein  NUTRITION DIAGNOSIS:   Increased nutrient needs related to chronic illness, acute illness as evidenced by estimated needs  GOAL:   Patient will meet greater than or equal to 90% of their needs  MONITOR:   PO intake, Supplement acceptance, Labs, Weight trends, Skin  REASON FOR ASSESSMENT:   Consult Assessment of nutrition requirement/status  ASSESSMENT:   62 y.o. Male with medical history significant for orthotopic heart transplant 2005 (followed by [facility] cardiology), Fahr disease (followed by [facility] neurology), chronic kidney disease, GERD, syncope and collapse (chronic and recurrent), hypertension, who presented to the ED with chief complaint syncope.  RD spoke with patient's wife via telephone. She reports pt has not been eating well. Has been having diarrhea. Also reveals he cannot use his dominant hand to feed himself.  Pt has tried [employer] in the past, however, wife stated it make him "sick". Wife has heard of Magic Cup dessert supplement. Amenable to RD ordering on meal trays. Medications include Vitamin D, Vitamin C, Protonix, Lomotil and ABX.  Wife mentioned pt gets "strangled" on his saliva. Speech Path following. Labs reviewed. Mg 1.4 (L). K 3.4 (L) on 1/8. CBG's [id number].  NUTRITION - FOCUSED PHYSICAL EXAM:  Unable to complete at this time  Diet Order:  Diet regular Room service appropriate? Yes; Fluid consistency: Thin  EDUCATION NEEDS:   No education needs have been identified at this time  Skin:  Skin Assessment: Reviewed RN Assessment  Last BM:  1/9  Height:   Ht Readings from Last 1 Encounters:  09/25/17 6\' 1"  (1.854 m)   Weight:   Wt Readings from Last 1 Encounters:  09/26/17 153 lb 4.8 oz (69.5 kg)   Ideal Body Weight:  83.6 kg  BMI:  Body mass index is 20.23  kg/m.  Estimated Nutritional Needs:   Kcal:  1800-2000  Protein:  90-110 gm  Fluid:  1.8-2.0 L  Darin Vega, RD, LDN Pager #: [phone] After-Hours Pager #: [phone]

## 2017-09-28 NOTE — Consult Note (Signed)
Consultation Note Date: 09/28/2017   Patient Name: Darin Vega  DOB: 1956/07/22  MRN: [id number]  Age / Sex: 62 y.o., male  PCP: Leanna Battles, MD Referring Physician: Mariel Aloe, MD  Reason for Consultation: Establishing goals of care and Psychosocial/spiritual support  HPI/Patient Profile: 62 y.o. 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    He was unresponsive. EMS was called, blood pressure was 70 systolic heart rate 662.  He was  hypoxic and placed on a nonrebreather mask. He got to the emergency department his oxygen saturation level was greater than 90%.   He is provided with IV fluids and antibiotics. At the time of admission he was alert and oriented,  temperature was noted  to be 101.7   Admitted for stabilization and treatment.  Today wife reports to me a slow continued physical and functional decline over the past 6 months. She speaks to no support at home, hospice had been initiated but was complicated by a speech device that would not be covered.  This was very upsetting to her.  Patient and family face emotional, spiritual, and financial struggles of living with a terminal neuro-degenerative disease.  They are  experiencing the frustation of limited resources for in home care.  Anticipatory care needs way heavy on his wife/ main caregiver.  Clinical Assessment and Goals of Care:  This NP Wadie Lessen reviewed medical records, received report from team, assessed the patient and then meet at the patient's bedside along with his wife to discuss diagnosis, prognosis, GOC,  disposition and options.  Concept of Hospice and Palliative Care were  discussed  A detailed discussion was had today regarding advanced directives.  Concepts specific to code status, artifical feeding and hydration, continued IV antibiotics and rehospitalization was had.  The difference between a aggressive medical intervention path  and a palliative comfort care path for this patient at this time was had.  Values and goals of care important to patient and family were attempted to be elicited.  Specific discussion was had regarding artifical feeding/hydration in light of the natural trajectory of his disease and likelihood that his ability to support himself nutritional is currently an issue and will get more difficult with disease progression.  Both adamantly verbalize they do not ever want a feeding tube, however they speak to being open to IV fluids.   MOST form introduced   Questions and concerns addressed.   Family encouraged to call with questions or concerns.    PMT will continue to support holistically.  HCPOA/wife    SUMMARY OF RECOMMENDATIONS    Code Status/Advance Care Planning:  DNR   Symptom Management:   Weakness: High risk for aspiration and decompensation very to progressive neuro degenerative disease. Discussed with patient and his wife the topic specific to artificial feeding.  They tell me the decision has made that there is no desire for artificial feeding now or into the future.  However they would be open to IV fluids intermittently  Palliative Prophylaxis:   Aspiration, Bowel Regimen and Oral Care  Additional Recommendations (Limitations, Scope, Preferences):  Full Scope Treatment  Psycho-social/Spiritual:   Desire for further Chaplaincy support:no  Additional Recommendations: Education on Hospice  Prognosis:   Unable to determine, will be impacted for desire for life prolonging interventions    Discharge Planning: Home with home health.  Unfortunately I had little to add to her home care needs outside of what has  already been put in place.  Stressed self care of the caregiver!     Primary Diagnoses: Present on Admission: . CKD (chronic kidney disease) stage 3, GFR 30-59 ml/min (HCC) . SIRS (systemic inflammatory response syndrome) (HCC) . Acute encephalopathy . Acute respiratory failure ([place]) . Elevated lactic acid level . Hypothermia . Tachycardia   I have reviewed the medical record, interviewed the patient and family, and examined the patient. The following aspects are pertinent.  Past Medical History:  Diagnosis Date  . CHF (congestive heart failure) ([place])    "before heart transplant in 2005  . 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 Headache(784.0)    "usually have one q other day; attributed to Fahr's syndrome" (09/25/2013)  . Heart transplanted ([place])   . History of kidney stones   . History of stomach ulcers    "before heart transplant"  . Hypertension   . Migraine    "don't get them often anymore" (09/25/2013)  . Multiple sclerosis ([place])   . Renal disorder    medication induced  . Syncope and collapse ~ 2010; 2011; 07/2013; 09/25/2013   Social History   Socioeconomic History  . Marital status: Married    Spouse name: None  . Number of children: None  . Years of education: None  . Highest education level: None  Social Needs  . Financial resource strain: None  . Food insecurity - worry: None  . Food insecurity - inability: None  . Transportation needs - medical: None  . Transportation needs - non-medical: None  Occupational History  . None  Tobacco Use  . Smoking status: Never Smoker  . Smokeless tobacco: Never Used  Substance and Sexual Activity  . Alcohol use: No  . Drug use: No  . Sexual activity: No  Other Topics Concern  . None  Social History Narrative  . None   History reviewed. No pertinent family history. Scheduled Meds: . amLODipine  2.5 mg Oral QHS  . aspirin EC  81 mg Oral Daily  . carvedilol  3.125 mg Oral BID  . cholecalciferol  1,000 Units Oral Daily  .  enoxaparin (LOVENOX) injection  40 mg Subcutaneous Q24H  . feeding supplement (ENSURE ENLIVE)  237 mL Oral BID BM  . loratadine  10 mg Oral Q lunch  . mouth rinse  15 mL Mouth Rinse BID  . pantoprazole  40 mg Oral Daily  . pravastatin  40 mg Oral QHS  . scopolamine  1 patch Transdermal Q72H  . sodium chloride  1 spray Each Nare BID  . tacrolimus  1 mg Oral BID  . triamcinolone  2 spray Nasal Daily  . vitamin C  500 mg Oral BID   Continuous Infusions: . piperacillin-tazobactam (ZOSYN)  IV 3.375 g (09/28/17 1248)  . sodium chloride     PRN Meds:.acetaminophen **OR** acetaminophen, diphenoxylate-atropine, HYDROcodone-acetaminophen, ondansetron **OR** ondansetron (ZOFRAN)  IV, polyvinyl alcohol Medications Prior to Admission:  Prior to Admission medications   Medication Sig Start Date End Date Taking? Authorizing Provider  amLODipine (NORVASC) 2.5 MG tablet Take 2.5 mg by mouth at bedtime. 03/25/17  Yes [provider]  aspirin EC 81 MG tablet Take 81 mg by mouth daily.   Yes [provider]  carvedilol (COREG) 3.125 MG tablet Take 3.125 mg by mouth 2 (two) times daily. 03/25/17  Yes [provider]  cholecalciferol (VITAMIN D) 1000 units tablet Take 1,000 Units by mouth daily.   Yes [provider]  diphenoxylate-atropine (LOMOTIL) 2.5-0.025 MG per tablet Take 1 tablet by mouth 2 (two) times daily between meals as needed for diarrhea or loose stools. 09/26/13  Yes Leanna Battles, MD  EPINEPHrine 0.3 mg/0.3 mL IJ SOAJ injection Inject 0.3 mLs (0.3 mg total) into the muscle once. Patient taking differently: Inject 0.3 mg into the muscle once as needed (severe allergic reaction).  07/12/14  Yes Riki Altes, MD  HYDROcodone-acetaminophen (NORCO/VICODIN) 5-325 MG per tablet Take 1 tablet by mouth 3 (three) times daily as needed (pain). For pain 07/27/13  Yes [provider]  loratadine (CLARITIN) 10 MG tablet Take 10 mg by mouth daily with lunch.    Yes  [provider]  Multiple Vitamin (MULTIVITAMIN WITH MINERALS) TABS tablet Take 1 tablet by mouth 3 (three) times daily. [facility] life essentials   Yes [provider]  pantoprazole (PROTONIX) 40 MG tablet Take 40 mg by mouth daily.    Yes [provider]  Phenylephrine-APAP-Guaifenesin (TYLENOL SINUS SEVERE PO) Take 1 tablet by mouth daily as needed (congestion).   Yes [provider]  Polyethylene Glycol 400 (BLINK TEARS OP) Place 1 drop into both eyes daily as needed (dry eyes).   Yes [provider]  pravastatin (PRAVACHOL) 80 MG tablet Take 40 mg by mouth at bedtime.   Yes [provider]  scopolamine (TRANSDERM-SCOP) 1 MG/3DAYS Place 1 patch onto the skin every 3 (three) days. 09/25/17  Yes [provider]  sodium chloride (OCEAN) 0.65 % SOLN nasal spray Place 1 spray into both nostrils 2 (two) times daily.   Yes [provider]  tacrolimus (PROGRAF) 1 MG capsule Take 1 mg by mouth 2 (two) times daily.  09/04/13  Yes [provider]  triamcinolone (NASACORT ALLERGY 24HR) 55 MCG/ACT AERO nasal inhaler Place 2 sprays into the nose daily.   Yes [provider]  vitamin C (ASCORBIC ACID) 500 MG tablet Take 500 mg by mouth 2 (two) times daily.   Yes [provider]   Allergies  Allergen Reactions  . Atrovent [Ipratropium] Anaphylaxis  . Other Anaphylaxis    Reaction to tree nuts  . Peanuts [Peanut Oil] Anaphylaxis  . Fluticasone Nausea Only  . Ace Inhibitors Cough   Review of Systems  HENT: Positive for trouble swallowing.   Neurological: Positive for weakness.    Physical Exam  Constitutional: He appears lethargic. He appears cachectic. He appears ill.  Cardiovascular: Normal rate, regular rhythm and normal heart sounds.  Pulmonary/Chest: He has decreased breath sounds in the right lower field and the left lower field.  Neurological: He appears lethargic.  Skin: Skin is warm and  dry.    Vital Signs: BP (!) 151/83 (BP Location: Left Arm)   Pulse 91   Temp 98.3 F (36.8 C) (Oral)   Resp 17   Ht 6\' 1"  (1.854 m)   Wt 69.5 kg (153 lb 4.8 oz)  SpO2 96%   BMI 20.23 kg/m  Pain Assessment: No/denies pain   Pain Score: 0-No pain   SpO2: SpO2: 96 % O2 Device:SpO2: 96 % O2 Flow Rate: .O2 Flow Rate (L/min): 15 L/min  IO: Intake/output summary:   Intake/Output Summary (Last 24 hours) at 09/28/2017 1253 Last data filed at 09/28/2017 1142 Gross per 24 hour  Intake 60 ml  Output 1703 ml  Net -1643 ml    LBM: Last BM Date: 09/28/17 Baseline Weight: Weight: 69.6 kg (153 lb 7 oz) Most recent weight: Weight: 69.5 kg (153 lb 4.8 oz)     Palliative Assessment/Data: 30 % at best   Discussed with case management and social work  Time In: 0930 Time Out: 1045  Time Total: 75 minutes Greater than 50%  of this time was spent counseling and coordinating care related to the above assessment and plan.  Signed by: Wadie Lessen, NP   Please contact Palliative Medicine Team phone at [phone] for questions and concerns.  For individual provider: See Shea Evans

## 2017-09-28 NOTE — Progress Notes (Addendum)
PROGRESS NOTE    Darin Vega  BZJ:[id number] DOB: 05-26-1956 DOA: 09/25/2017 PCP: Leanna Battles, MD   Brief Narrative: Darin Vega is a 62 y.o. malewith medical history significantfor orthotopic heart transplant 2005 (followed by [facility] cardiology), Fahr disease with progressive neurologic decline (followed by [facility] neurology),chronic kidney disease 3, GERD, syncope and collapse (chronic and recurrent), hypertension, who presents to the Emergency Department chief complaint syncope. Information is gathered from wife. Reportedly his wife found him on morning of admission, slumped over his walker. He was unresponsive. EMS was called and found his blood pressure was 70 systolic, heart rate 017. He is provided with 1 L IV fluids. He was also noted to be hypoxic and placed on a nonrebreather mask. He got to the emergency department and his oxygen saturation level was greater than 90%. He was provided with IV fluids and antibiotics and admitted to stepdown unit for presumptive severe sepsis.      Assessment & Plan:   Principal Problem:   Acute encephalopathy Active Problems:   CKD (chronic kidney disease) stage 3, GFR 30-59 ml/min (HCC)   SIRS (systemic inflammatory response syndrome) (HCC)   Acute respiratory failure (HCC)   Elevated lactic acid level   Hypothermia   Tachycardia   Syncope Chronic issue. Secondary to vasodepressor syncope. S/p loop recorder.  SIRS Unknown source. Patient presented with hypotension, tachypnea, elevated lactic acid. Initially febrile with no white count. Unsure if this was secondary to infectious source vs other etiology.  Essential hypertension -Continue Coreg and amlodipine  Acute respiratory failure with hypoxia Weaned to room air. Unknown etiology.  Hyperlipidemia -Continued Pravachol  History of orthotopic heart transplant Follows at [facility] -Continue prograf  Diarrhea Chronic. -Continue Linzess  Chronic kidney  disease Stable.  Fahr's disease Follows at [facility] -PT/OT/SLP  Chronic normocytic anemia Stable.  Hypokalemia Supplementation   DVT prophylaxis: Lovenox Code Status: DNR Family Communication: Wife at bedside Disposition Plan: Discharge to SNF when stable   Consultants:   Palliative care medicine  Procedures:   None  Antimicrobials:  Zosyn  Vancomycin   Subjective: No abdominal pain. No dyspnea. No chest pain.  Objective: Vitals:   09/27/17 1310 09/27/17 1636 09/27/17 2148 09/28/17 0458  BP: (!) 132/99 (!) 135/95 (!) 147/97 (!) 151/83  Pulse: 84 94 100 91  Resp: 19 17    Temp: 98.1 F (36.7 C) 97.6 F (36.4 C) 97.7 F (36.5 C) 98.3 F (36.8 C)  TempSrc: Oral Oral Oral Oral  SpO2: 100% 96% 93% 96%  Weight:      Height:        Intake/Output Summary (Last 24 hours) at 09/28/2017 0938 Last data filed at 09/27/2017 2300 Gross per 24 hour  Intake 60 ml  Output 702 ml  Net -642 ml   Filed Weights   09/25/17 1814 09/26/17 0400  Weight: 69.6 kg (153 lb 7 oz) 69.5 kg (153 lb 4.8 oz)    Examination:  General exam: Appears calm and comfortable. Respiratory system: Clear to auscultation but diminished. Respiratory effort normal. Cardiovascular system: S1 & S2 heard, RRR. No murmurs, rubs, gallops or clicks. Gastrointestinal system: Abdomen is nondistended, soft and nontender. No organomegaly or masses felt. Normal bowel sounds heard. Central nervous system: Alert and oriented. CN intact Extremities: No edema. No calf tenderness Skin: No cyanosis. No rashes Psychiatry: Judgement and insight appear normal. Mood & affect appropriate.     Data Reviewed: I have personally reviewed following labs and imaging studies  CBC: Recent Labs  Lab 09/25/17 0958 09/26/17 0202 09/27/17 0342 09/28/17 0219  WBC 10.0 9.3 6.9 7.1  NEUTROABS 5.5  --   --   --   HGB 14.0 11.1* 11.1* 11.3*  HCT 42.9 35.5* 34.6* 35.7*  MCV 92.7 92.9 93.3 92.0  PLT 229 157 152 154    Basic Metabolic Panel: Recent Labs  Lab 09/25/17 0958 09/26/17 0202 09/27/17 0342 09/28/17 0219  NA 140 140 140 140  K 3.5 4.1 3.4* 3.6  CL 108 109 107 107  CO2 22 24 22 25   GLUCOSE 182* 102* 88 93  BUN 29* 29* 19 18  CREATININE 2.07* 2.06* 2.01* 2.01*  CALCIUM 8.7* 8.1* 8.6* 8.8*  MG  --   --   --  1.4*   GFR: Estimated Creatinine Clearance: 37 mL/min (A) (by C-G formula based on SCr of 2.01 mg/dL (H)). Liver Function Tests: Recent Labs  Lab 09/25/17 0958  AST 24  ALT 11*  ALKPHOS 55  BILITOT 1.2  PROT 6.8  ALBUMIN 3.1*   No results for input(s): LIPASE, AMYLASE in the last 168 hours. No results for input(s): AMMONIA in the last 168 hours. Coagulation Profile: Recent Labs  Lab 09/25/17 0958  INR 1.08   Cardiac Enzymes: Recent Labs  Lab 09/25/17 1400 09/25/17 1910 09/26/17 0202  TROPONINI 0.04* <0.03 <0.03   BNP (last 3 results) No results for input(s): PROBNP in the last 8760 hours. HbA1C: No results for input(s): HGBA1C in the last 72 hours. CBG: Recent Labs  Lab 09/25/17 0951  GLUCAP 171*   Lipid Profile: No results for input(s): CHOL, HDL, LDLCALC, TRIG, CHOLHDL, LDLDIRECT in the last 72 hours. Thyroid Function Tests: No results for input(s): TSH, T4TOTAL, FREET4, T3FREE, THYROIDAB in the last 72 hours. Anemia Panel: No results for input(s): VITAMINB12, FOLATE, FERRITIN, TIBC, IRON, RETICCTPCT in the last 72 hours. Sepsis Labs: Recent Labs  Lab 09/25/17 1000 09/25/17 1232  LATICACIDVEN 3.99* 2.12*    Recent Results (from the past 240 hour(s))  Culture, blood (Routine x 2)     Status: None (Preliminary result)   Collection Time: 09/25/17  9:58 AM  Result Value Ref Range Status   Specimen Description BLOOD LEFT ANTECUBITAL  Final   Special Requests   Final    BOTTLES DRAWN AEROBIC AND ANAEROBIC Blood Culture adequate volume   Culture NO GROWTH 2 DAYS  Final   Report Status PENDING  Incomplete  Culture, blood (Routine x 2)      Status: None (Preliminary result)   Collection Time: 09/25/17 11:17 AM  Result Value Ref Range Status   Specimen Description BLOOD RIGHT FOREARM  Final   Special Requests   Final    BOTTLES DRAWN AEROBIC AND ANAEROBIC Blood Culture adequate volume   Culture NO GROWTH 2 DAYS  Final   Report Status PENDING  Incomplete  Gastrointestinal Panel by PCR , Stool     Status: None   Collection Time: 09/25/17 11:52 AM  Result Value Ref Range Status   Campylobacter species NOT DETECTED NOT DETECTED Final   Plesimonas shigelloides NOT DETECTED NOT DETECTED Final   Salmonella species NOT DETECTED NOT DETECTED Final   Yersinia enterocolitica NOT DETECTED NOT DETECTED Final   Vibrio species NOT DETECTED NOT DETECTED Final   Vibrio cholerae NOT DETECTED NOT DETECTED Final   Enteroaggregative E coli (EAEC) NOT DETECTED NOT DETECTED Final   Enteropathogenic E coli (EPEC) NOT DETECTED NOT DETECTED Final   Enterotoxigenic E coli (ETEC) NOT DETECTED NOT DETECTED Final  Shiga like toxin producing E coli (STEC) NOT DETECTED NOT DETECTED Final   Shigella/Enteroinvasive E coli (EIEC) NOT DETECTED NOT DETECTED Final   Cryptosporidium NOT DETECTED NOT DETECTED Final   Cyclospora cayetanensis NOT DETECTED NOT DETECTED Final   Entamoeba histolytica NOT DETECTED NOT DETECTED Final   Giardia lamblia NOT DETECTED NOT DETECTED Final   Adenovirus F40/41 NOT DETECTED NOT DETECTED Final   Astrovirus NOT DETECTED NOT DETECTED Final   Norovirus GI/GII NOT DETECTED NOT DETECTED Final   Rotavirus A NOT DETECTED NOT DETECTED Final   Sapovirus (I, II, IV, and V) NOT DETECTED NOT DETECTED Final    Comment: Performed at [facility], [address] [place], [place] [address]  C difficile quick scan w PCR reflex     Status: None   Collection Time: 09/25/17 11:52 AM  Result Value Ref Range Status   C Diff antigen NEGATIVE NEGATIVE Final   C Diff toxin NEGATIVE NEGATIVE Final   C Diff interpretation No C. difficile  detected.  Final  Respiratory Panel by PCR     Status: None   Collection Time: 09/26/17  4:32 PM  Result Value Ref Range Status   Adenovirus NOT DETECTED NOT DETECTED Final   Coronavirus 229E NOT DETECTED NOT DETECTED Final   Coronavirus HKU1 NOT DETECTED NOT DETECTED Final   Coronavirus NL63 NOT DETECTED NOT DETECTED Final   Coronavirus OC43 NOT DETECTED NOT DETECTED Final   Metapneumovirus NOT DETECTED NOT DETECTED Final   Rhinovirus / Enterovirus NOT DETECTED NOT DETECTED Final   Influenza A NOT DETECTED NOT DETECTED Final   Influenza B NOT DETECTED NOT DETECTED Final   Parainfluenza Virus 1 NOT DETECTED NOT DETECTED Final   Parainfluenza Virus 2 NOT DETECTED NOT DETECTED Final   Parainfluenza Virus 3 NOT DETECTED NOT DETECTED Final   Parainfluenza Virus 4 NOT DETECTED NOT DETECTED Final   Respiratory Syncytial Virus NOT DETECTED NOT DETECTED Final   Bordetella pertussis NOT DETECTED NOT DETECTED Final   Chlamydophila pneumoniae NOT DETECTED NOT DETECTED Final   Mycoplasma pneumoniae NOT DETECTED NOT DETECTED Final         Radiology Studies: No results found.      Scheduled Meds: . amLODipine  2.5 mg Oral QHS  . aspirin EC  81 mg Oral Daily  . carvedilol  3.125 mg Oral BID  . cholecalciferol  1,000 Units Oral Daily  . enoxaparin (LOVENOX) injection  40 mg Subcutaneous Q24H  . feeding supplement (ENSURE ENLIVE)  237 mL Oral BID BM  . loratadine  10 mg Oral Q lunch  . mouth rinse  15 mL Mouth Rinse BID  . pantoprazole  40 mg Oral Daily  . pravastatin  40 mg Oral QHS  . scopolamine  1 patch Transdermal Q72H  . sodium chloride  1 spray Each Nare BID  . tacrolimus  1 mg Oral BID  . triamcinolone  2 spray Nasal Daily  . vitamin C  500 mg Oral BID   Continuous Infusions: . magnesium sulfate 1 - 4 g bolus IVPB    . piperacillin-tazobactam (ZOSYN)  IV 3.375 g (09/28/17 0512)  . sodium chloride       LOS: 3 days     Cordelia Poche, MD [facility] 09/28/2017,  9:38 AM Pager: [phone]  If 7PM-7AM, please contact night-coverage [email or web address] Password [facility] 09/28/2017, 9:38 AM

## 2017-09-29 DIAGNOSIS — Z66 Do not resuscitate: Secondary | ICD-10-CM

## 2017-09-29 DIAGNOSIS — Z515 Encounter for palliative care: Secondary | ICD-10-CM

## 2017-09-29 MED ORDER — SIMETHICONE 80 MG PO CHEW: 80.0000 mg | CHEWABLE_TABLET | Freq: Four times a day (QID) | ORAL | Status: DC | PRN

## 2017-09-29 NOTE — Progress Notes (Signed)
Occupational Therapy Evaluation Patient Details Name: Darin Vega MRN: [id number] DOB: 10-14-1955 Today's Date: 09/29/2017    History of Present Illness 62yo male admitted following syncope, found to be hypothermic, hypotensive, tachycardic, and hypoxic in the ED. Note significant history of Fahr's syndome, recent diagnosis of vasodepressor syndrome, and history of syncope following heart transplant. PMH heart transplant 2005, Fahr's neurodegenerative syndrome, HTN, MS, renal disorder    Clinical Impression   PTA, pt lived at home with wife and states he was able to take care of himself and do his own ADL tasks. Pt presents with below deficits, requires min A with mobility and mod A with LB ADL and will benefit from rehab at SNF to maximize functional level of performance to facilitate safe return home. HR 115 with mobility. Pt appreciative of help. Will follow acutely to address established goals.     Follow Up Recommendations  SNF;Supervision/Assistance - 24 hour    Equipment Recommendations  None recommended by OT    Recommendations for Other Services       Precautions / Restrictions Precautions Precautions: Fall Precaution Comments: hx of heart transplant, neurodegenerative disease, frequent falls  Restrictions Weight Bearing Restrictions: No      Mobility Bed Mobility Overal bed mobility: Needs Assistance Bed Mobility: Sit to Supine;Supine to Sit     Supine to sit: HOB elevated;Supervision Sit to supine: Min guard      Transfers Overall transfer level: Needs assistance Equipment used: 4-wheeled walker Transfers: Sit to/from [facility] Sit to Stand: Min guard Stand pivot transfers: Min guard            Balance Overall balance assessment: Needs assistance;History of Falls Sitting-balance support: Feet supported;Bilateral upper extremity supported Sitting balance-Leahy Scale: Good     Standing balance support: Bilateral upper extremity  supported;During functional activity Standing balance-Leahy Scale: Fair                             ADL either performed or assessed with clinical judgement   ADL Overall ADL's : Needs assistance/impaired Eating/Feeding: Set up Eating/Feeding Details (indicate cue type and reason): uses red tubing. Given additional tubing Grooming: Set up;Sitting;Wash/dry hands;Wash/dry face;Oral care   Upper Body Bathing: Set up;Sitting   Lower Body Bathing: Moderate assistance;Sit to/from stand   Upper Body Dressing : Minimal assistance;Sitting   Lower Body Dressing: Moderate assistance;Sit to/from stand   Toilet Transfer: Minimal assistance;Ambulation(rollator)   Toileting- Water quality scientist and Hygiene: Moderate assistance Toileting - Clothing Manipulation Details (indicate cue type and reason): condom cath/incontinent of BM/uses briefs     Functional mobility during ADLs: Minimal assistance(rollator)       Vision Baseline Vision/History: Wears glasses       Perception     Praxis      Pertinent Vitals/Pain Pain Assessment: No/denies pain Faces Pain Scale: No hurt     Hand Dominance Right   Extremity/Trunk Assessment Upper Extremity Assessment Upper Extremity Assessment: RUE deficits/detail RUE Deficits / Details: generalized weakness but uses as dominant hand   Lower Extremity Assessment Lower Extremity Assessment: Generalized weakness;Defer to PT evaluation   Cervical / Trunk Assessment Cervical / Trunk Assessment: Kyphotic   Communication Communication Communication: Expressive difficulties   Cognition Arousal/Alertness: Awake/alert Behavior During Therapy: WFL for tasks assessed/performed;Flat affect Overall Cognitive Status: History of cognitive impairments - at baseline  General Comments       Exercises     Shoulder Instructions      Home Living Family/patient expects to be discharged  to:: Skilled nursing facility Living Arrangements: Spouse/significant other                               Additional Comments: wife is very anxious and states "if the recommendation is for him to return home, I will refuse it"  " I've been taking care of him for 13 years"      Prior Functioning/Environment Level of Independence: Independent with assistive device(s)        Comments: history of frequent falls         OT Problem List: Decreased strength;Decreased activity tolerance;Impaired balance (sitting and/or standing);Decreased safety awareness;Decreased knowledge of use of DME or AE;Cardiopulmonary status limiting activity      OT Treatment/Interventions: Self-care/ADL training;Therapeutic exercise;Energy conservation;DME and/or AE instruction;Therapeutic activities;Patient/family education    OT Goals(Current goals can be found in the care plan section) Acute Rehab OT Goals Patient Stated Goal: to get better  OT Goal Formulation: With patient Time For Goal Achievement: 10/13/17 Potential to Achieve Goals: Good  OT Frequency: Min 2X/week   Barriers to D/C:            Co-evaluation              AM-PAC PT "6 Clicks" Daily Activity     Outcome Measure Help from another person eating meals?: A Little Help from another person taking care of personal grooming?: A Little Help from another person toileting, which includes using toliet, bedpan, or urinal?: A Little Help from another person bathing (including washing, rinsing, drying)?: A Little Help from another person to put on and taking off regular upper body clothing?: A Little Help from another person to put on and taking off regular lower body clothing?: A Little 6 Click Score: 18   End of Session Equipment Utilized During Treatment: Gait belt(rollator) Nurse Communication: Mobility status  Activity Tolerance: Patient tolerated treatment well Patient left: in bed;with call bell/phone within  reach;with bed alarm set;with family/visitor present  OT Visit Diagnosis: Unsteadiness on feet (R26.81);Muscle weakness (generalized) (M62.81);Repeated falls (R29.6)                Time: 1330-1350 OT Time Calculation (min): 20 min Charges:  OT General Charges $OT Visit: 1 Visit OT Evaluation $OT Eval Moderate Complexity: 1 Mod G-Codes:     Emile Ringgenberg, OT/L  [phone] 09/29/2017  Anuja Manka,HILLARY 09/29/2017, 2:31 PM

## 2017-09-29 NOTE — Care Management Important Message (Signed)
Important Message  Patient Details  Name: Darin Vega MRN: [id number] Date of Birth: [birth year]   Medicare Important Message Given:  Yes    Nathen May 09/29/2017, 12:01 PM

## 2017-09-29 NOTE — Progress Notes (Signed)
Physical Therapy Treatment Patient Details Name: Darin Vega MRN: [id number] DOB: 01-Jul-1956 Today's Date: 09/29/2017    History of Present Illness 61yo male admitted following syncope, found to be hypothermic, hypotensive, tachycardic, and hypoxic in the ED. Note significant history of Fahr's syndome, recent diagnosis of vasodepressor syndrome, and history of syncope following heart transplant. PMH heart transplant 2005, Fahr's neurodegenerative syndrome, HTN, MS, renal disorder     PT Comments    Patient received in bed with family present, pleasant and willing to participate with PT this afternoon. Able to complete bed mobility with general S today, continues to require min guard for functional transfers and ambulation. He demonstrates good safety awareness and navigation skills with 4 wheeled walker but does require assistance in self-care/cleaning after toileting. Gait trained approximately 52f to bathroom, then approximately 52fin hallway with min guard for safety due to general unsteadiness and gait deviations noted below, note per portable monitor O2 desat to 79%/HR increase to 148BPM on room air however patient asymptomatic and both improve/return to baseline with rest and pursed lip breathing. Patient is reliant on B UE support on walker as he tends to lose his balance when taking one hand off walker briefly to wave to nurse in hallway. He was left up in the chair with family/visitors present and all needs otherwise met this afternoon, educated not to get up without hospital staff present.     Follow Up Recommendations  SNF     Equipment Recommendations  None recommended by PT    Recommendations for Other Services OT consult     Precautions / Restrictions Precautions Precautions: Fall Precaution Comments: hx of heart transplant, neurodegenerative disease, frequent falls, watch HR/O2 sats! Restrictions Weight Bearing Restrictions: No    Mobility  Bed Mobility Overal bed  mobility: Needs Assistance Bed Mobility: Supine to Sit     Supine to sit: HOB elevated Sit to supine: Supervision   General bed mobility comments: S primarily for line management, occasional VC for safety   Transfers Overall transfer level: Needs assistance Equipment used: 4-wheeled walker Transfers: Sit to/from Stand Sit to Stand: Min guard Stand pivot transfers: Min guard       General transfer comment: VC for sequencing and safety   Ambulation/Gait Ambulation/Gait assistance: Min guard Ambulation Distance (Feet): 60 Feet(1053fo bathroom, then 85f18f hallway ) Assistive device: 4-wheeled walker Gait Pattern/deviations: Step-through pattern;Decreased step length - right;Decreased step length - left;Decreased stride length;Decreased dorsiflexion - right;Decreased dorsiflexion - left;Drifts right/left;Trunk flexed;Narrow base of support     General Gait Details: min guard for safety, slow but steady at self-selected pace; per portable monitor O2 desat to 79% RA but patient asymptomatic, HR increase to 148BPM- both values improve with pursed lip breathing and rest    Stairs            Wheelchair Mobility    Modified Rankin (Stroke Patients Only)       Balance Overall balance assessment: Needs assistance;History of Falls Sitting-balance support: Feet supported;Bilateral upper extremity supported Sitting balance-Leahy Scale: Good     Standing balance support: Bilateral upper extremity supported;During functional activity Standing balance-Leahy Scale: Fair Standing balance comment: loses balance when taking one hand off of walker to wave to nurse in hallway, reliant on BUE support                             Cognition Arousal/Alertness: Awake/alert Behavior During Therapy: WFL [facility] tasks assessed/performed;Flat affect  Overall Cognitive Status: History of cognitive impairments - at baseline                                         Exercises      General Comments General comments (skin integrity, edema, etc.): apparent O2 desat to 79%/HR increase to 148BPM with gait, both recover with pursed lip breathing and rest on room air, RN aware       Pertinent Vitals/Pain Pain Assessment: 0-10 Pain Score: 5  Faces Pain Scale: No hurt Pain Location: headache  Pain Descriptors / Indicators: Aching Pain Intervention(s): Limited activity within patient's tolerance;Monitored during session    Home Living Family/patient expects to be discharged to:: Skilled nursing facility Living Arrangements: Spouse/significant other             Additional Comments: wife is very anxious and states "if the recommendation is for him to return home, I will refuse it"  " I've been taking care of him for 13 years"    Prior Function Level of Independence: Independent with assistive device(s)      Comments: history of frequent falls    PT Goals (current goals can now be found in the care plan section) Acute Rehab PT Goals Patient Stated Goal: to get better  PT Goal Formulation: With patient/family Time For Goal Achievement: 10/11/17 Potential to Achieve Goals: Good Progress towards PT goals: Progressing toward goals    Frequency    Min 3X/week      PT Plan Current plan remains appropriate    Co-evaluation              AM-PAC PT "6 Clicks" Daily Activity  Outcome Measure  Difficulty turning over in bed (including adjusting bedclothes, sheets and blankets)?: Unable Difficulty moving from lying on back to sitting on the side of the bed? : Unable Difficulty sitting down on and standing up from a chair with arms (e.g., wheelchair, bedside commode, etc,.)?: Unable Help needed moving to and from a bed to chair (including a wheelchair)?: A Little Help needed walking in hospital room?: A Little Help needed climbing 3-5 steps with a railing? : A Lot 6 Click Score: 11    End of Session Equipment Utilized During Treatment:  Gait belt Activity Tolerance: Patient tolerated treatment well Patient left: in chair;with call bell/phone within reach;with family/visitor present Nurse Communication: Mobility status;Other (comment)(O2 desat/HR increase with gait on room air; transfer technique for back to bed ) PT Visit Diagnosis: Unsteadiness on feet (R26.81);Muscle weakness (generalized) (M62.81);Other abnormalities of gait and mobility (R26.89);History of falling (Z91.81);Other symptoms and signs involving the nervous system (R29.898)     Time: 4360-6770 PT Time Calculation (min) (ACUTE ONLY): 24 min  Charges:  $Gait Training: 8-22 mins $Therapeutic Activity: 8-22 mins                    G Codes:  Functional Assessment Tool Used: AM-PAC 6 Clicks Basic Mobility;Clinical judgement    Deniece Ree PT, DPT, CBIS  Supplemental Physical Therapist [place]   Pager [phone]

## 2017-09-29 NOTE — Clinical Social Work Note (Signed)
Clinical Social Work Assessment  Patient Details  Name: Darin Vega MRN: [id number] Date of Birth: 07-25-1956  Date of referral:  09/29/17               Reason for consult:  Discharge Planning, Facility Placement                Permission sought to share information with:  Family Supports Permission granted to share information::  Yes, Verbal Permission Granted  Name::     Darin Vega  Agency::  snf  Relationship::  spouse  Contact Information:  [phone]  Housing/Transportation Living arrangements for the past 2 months:  Price of Information:    Patient Interpreter Needed:  Other (Comment Required)(pt has wife speaking for him) Criminal Activity/Legal Involvement Pertinent to Current Situation/Hospitalization:  No - Comment as needed Significant Relationships:  Spouse, Adult Children, Other Family Members Lives with:  Spouse Do you feel safe going back to the place where you live?  Yes Need for family participation in patient care:  Yes (Comment)  Care giving concerns:  Spouse at bedside. patients wife angela stated it is just the two of them in the home and she does not have support.   Social Worker assessment / plan:  CSW met patient/family at bedside to discuss disposition plan and offer support. Darin Vega seems very overwhelmed with patients care at home stating she needs a break. CSW asked if patient/family would be open to discharge to SNF. Darin Vega stated at this point she is open to any suggestion. Darin Vega stated she does not feel that patient is medically ready for discharge but agreeable for CSW to fax patient out to facility. Darin Vega asked what the plan would be for patient stating she is does not have the money to hire anyone to come into the home with help with care. Darin Vega stated she also does not feel comfortable with anyone being in the home taking care of her husband because she does not feel that an aide would be able to take care of patient at the  same level as she would want. CSW made SNF referral and gave angela a list of facility that has made a bed offer to patient. Darin Vega is aware that patient is medically ready for discharge but stated she would like to visit the facilities before making a decision  Employment status:  Retired Insurance underwriter information:  Medicare PT Recommendations:  [place] / Referral to community resources:  [place]  Patient/Family's Response to care:  Darin Vega seems very overwhelmed with patients prognosis   Patient/Family's Understanding of and Emotional Response to Diagnosis, Current Treatment, and Prognosis:  Darin Vega has understanding of patient recent hospitalization but patients diagnoses is difficult for Darin Vega to cope with Emotional Assessment Appearance:  Appears older than stated age Attitude/Demeanor/Rapport:  Other Affect (typically observed):  Accepting Orientation:  Oriented to Place, Oriented to  Time, Oriented to Situation, Oriented to Self Alcohol / Substance use:  Not Applicable Psych involvement (Current and /or in the community):  No (Comment)  Discharge Needs  Concerns to be addressed:  Other (Comment Required, Adjustment to Illness Readmission within the last 30 days:  No Current discharge risk:  None Barriers to Discharge:  No Barriers Identified   Wende Neighbors, LCSW 09/29/2017, 2:37 PM

## 2017-09-29 NOTE — Progress Notes (Signed)
OT Cancellation Note  Patient Details Name: Darin Vega MRN: [id number] DOB: 1956-01-13   Cancelled Treatment:    Reason Eval/Treat Not Completed: Patient at procedure or test/ unavailable. Palliative came to see pt. Will return this pm if able.   [place], OT/L  [phone] 09/29/2017 09/29/2017, 9:48 AM

## 2017-09-29 NOTE — Progress Notes (Signed)
PROGRESS NOTE    ZACKERIAH KISSLER  KGU:[id number] DOB: April 24, 1956 DOA: 09/25/2017 PCP: Leanna Battles, MD   Brief Narrative: Darin Vega is a 62 y.o. malewith medical history significantfor orthotopic heart transplant 2005 (followed by [facility] cardiology), Fahr disease with progressive neurologic decline (followed by [facility] neurology),chronic kidney disease 3, GERD, syncope and collapse (chronic and recurrent), hypertension, who presents to the Emergency Department chief complaint syncope. Information is gathered from wife. Reportedly his wife found him on morning of admission, slumped over his walker. He was unresponsive. EMS was called and found his blood pressure was 70 systolic, heart rate 628. He is provided with 1 L IV fluids. He was also noted to be hypoxic and placed on a nonrebreather mask. He got to the emergency department and his oxygen saturation level was greater than 90%. He was provided with IV fluids and antibiotics and admitted to stepdown unit for presumptive severe sepsis.      Assessment & Plan:   Principal Problem:   Acute encephalopathy Active Problems:   CKD (chronic kidney disease) stage 3, GFR 30-59 ml/min (HCC)   SIRS (systemic inflammatory response syndrome) (HCC)   Acute respiratory failure (HCC)   Elevated lactic acid level   Hypothermia   Tachycardia   Syncope Chronic issue. Secondary to vasodepressor syncope. S/p loop recorder.  SIRS Unknown source. Patient presented with hypotension, tachypnea, elevated lactic acid. Initially febrile with no white count. Unsure if this was secondary to infectious source vs other etiology. -Discontinue Zosyn since no source and likely contributing to diarrhea.  Essential hypertension -Continue Coreg and amlodipine  Acute respiratory failure with hypoxia Weaned to room air. Unknown etiology.  Hyperlipidemia -Continued Pravachol  History of orthotopic heart transplant Follows at [facility] -Continue  prograf  Diarrhea Chronic. Appears to have improved. -Continue Linzess  Chronic kidney disease Stable.  Fahr's disease Follows at [facility] -PT/OT/SLP -Palliative care consult pending  Chronic normocytic anemia Stable.  Hypokalemia Supplementation   DVT prophylaxis: Lovenox Code Status: DNR Family Communication: Wife at bedside Disposition Plan: Discharge to SNF when bed available.   Consultants:   Palliative care medicine  Procedures:   None  Antimicrobials:  Zosyn  Vancomycin   Subjective: No abdominal pain. One bowel movement yesterday.  Objective: Vitals:   09/27/17 2148 09/28/17 0458 09/28/17 2026 09/29/17 0429  BP: (!) 147/97 (!) 151/83 (!) 123/93 (!) 147/96  Pulse: 100 91 98 94  Resp:   14 14  Temp: 97.7 F (36.5 C) 98.3 F (36.8 C) 97.8 F (36.6 C) 97.7 F (36.5 C)  TempSrc: Oral Oral Oral Oral  SpO2: 93% 96% 96% 95%  Weight:      Height:        Intake/Output Summary (Last 24 hours) at 09/29/2017 1416 Last data filed at 09/29/2017 0500 Gross per 24 hour  Intake 300 ml  Output 1750 ml  Net -1450 ml   Filed Weights   09/25/17 1814 09/26/17 0400  Weight: 69.6 kg (153 lb 7 oz) 69.5 kg (153 lb 4.8 oz)    Examination:  General exam: Appears calm and comfortable. Respiratory system: Clear to auscultation but diminished. Respiratory effort normal. Cardiovascular system: S1 & S2 heard, RRR. No murmurs, rubs, gallops or clicks. Gastrointestinal system: Abdomen is mildly distended, soft and nontender. No organomegaly or masses felt. Normal bowel sounds heard. Central nervous system: Alert and oriented. CN intact Extremities: No edema. No calf tenderness Skin: No cyanosis. No rashes Psychiatry: Judgement and insight appear normal. Mood & affect appropriate.  Data Reviewed: I have personally reviewed following labs and imaging studies  CBC: Recent Labs  Lab 09/25/17 0958 09/26/17 0202 09/27/17 0342 09/28/17 0219  WBC 10.0 9.3 6.9  7.1  NEUTROABS 5.5  --   --   --   HGB 14.0 11.1* 11.1* 11.3*  HCT 42.9 35.5* 34.6* 35.7*  MCV 92.7 92.9 93.3 92.0  PLT 229 157 152 629   Basic Metabolic Panel: Recent Labs  Lab 09/25/17 0958 09/26/17 0202 09/27/17 0342 09/28/17 0219  NA 140 140 140 140  K 3.5 4.1 3.4* 3.6  CL 108 109 107 107  CO2 22 24 22 25   GLUCOSE 182* 102* 88 93  BUN 29* 29* 19 18  CREATININE 2.07* 2.06* 2.01* 2.01*  CALCIUM 8.7* 8.1* 8.6* 8.8*  MG  --   --   --  1.4*   GFR: Estimated Creatinine Clearance: 37 mL/min (A) (by C-G formula based on SCr of 2.01 mg/dL (H)). Liver Function Tests: Recent Labs  Lab 09/25/17 0958  AST 24  ALT 11*  ALKPHOS 55  BILITOT 1.2  PROT 6.8  ALBUMIN 3.1*   No results for input(s): LIPASE, AMYLASE in the last 168 hours. No results for input(s): AMMONIA in the last 168 hours. Coagulation Profile: Recent Labs  Lab 09/25/17 0958  INR 1.08   Cardiac Enzymes: Recent Labs  Lab 09/25/17 1400 09/25/17 1910 09/26/17 0202  TROPONINI 0.04* <0.03 <0.03   BNP (last 3 results) No results for input(s): PROBNP in the last 8760 hours. HbA1C: No results for input(s): HGBA1C in the last 72 hours. CBG: Recent Labs  Lab 09/25/17 0951  GLUCAP 171*   Lipid Profile: No results for input(s): CHOL, HDL, LDLCALC, TRIG, CHOLHDL, LDLDIRECT in the last 72 hours. Thyroid Function Tests: No results for input(s): TSH, T4TOTAL, FREET4, T3FREE, THYROIDAB in the last 72 hours. Anemia Panel: No results for input(s): VITAMINB12, FOLATE, FERRITIN, TIBC, IRON, RETICCTPCT in the last 72 hours. Sepsis Labs: Recent Labs  Lab 09/25/17 1000 09/25/17 1232  LATICACIDVEN 3.99* 2.12*    Recent Results (from the past 240 hour(s))  Culture, blood (Routine x 2)     Status: None (Preliminary result)   Collection Time: 09/25/17  9:58 AM  Result Value Ref Range Status   Specimen Description BLOOD LEFT ANTECUBITAL  Final   Special Requests   Final    BOTTLES DRAWN AEROBIC AND ANAEROBIC  Blood Culture adequate volume   Culture NO GROWTH 3 DAYS  Final   Report Status PENDING  Incomplete  Culture, blood (Routine x 2)     Status: None (Preliminary result)   Collection Time: 09/25/17 11:17 AM  Result Value Ref Range Status   Specimen Description BLOOD RIGHT FOREARM  Final   Special Requests   Final    BOTTLES DRAWN AEROBIC AND ANAEROBIC Blood Culture adequate volume   Culture NO GROWTH 3 DAYS  Final   Report Status PENDING  Incomplete  Gastrointestinal Panel by PCR , Stool     Status: None   Collection Time: 09/25/17 11:52 AM  Result Value Ref Range Status   Campylobacter species NOT DETECTED NOT DETECTED Final   Plesimonas shigelloides NOT DETECTED NOT DETECTED Final   Salmonella species NOT DETECTED NOT DETECTED Final   Yersinia enterocolitica NOT DETECTED NOT DETECTED Final   Vibrio species NOT DETECTED NOT DETECTED Final   Vibrio cholerae NOT DETECTED NOT DETECTED Final   Enteroaggregative E coli (EAEC) NOT DETECTED NOT DETECTED Final   Enteropathogenic E coli (EPEC) NOT  DETECTED NOT DETECTED Final   Enterotoxigenic E coli (ETEC) NOT DETECTED NOT DETECTED Final   Shiga like toxin producing E coli (STEC) NOT DETECTED NOT DETECTED Final   Shigella/Enteroinvasive E coli (EIEC) NOT DETECTED NOT DETECTED Final   Cryptosporidium NOT DETECTED NOT DETECTED Final   Cyclospora cayetanensis NOT DETECTED NOT DETECTED Final   Entamoeba histolytica NOT DETECTED NOT DETECTED Final   Giardia lamblia NOT DETECTED NOT DETECTED Final   Adenovirus F40/41 NOT DETECTED NOT DETECTED Final   Astrovirus NOT DETECTED NOT DETECTED Final   Norovirus GI/GII NOT DETECTED NOT DETECTED Final   Rotavirus A NOT DETECTED NOT DETECTED Final   Sapovirus (I, II, IV, and V) NOT DETECTED NOT DETECTED Final    Comment: Performed at [facility], [address] [place], [place] [address]  C difficile quick scan w PCR reflex     Status: None   Collection Time: 09/25/17 11:52 AM  Result Value  Ref Range Status   C Diff antigen NEGATIVE NEGATIVE Final   C Diff toxin NEGATIVE NEGATIVE Final   C Diff interpretation No C. difficile detected.  Final  Respiratory Panel by PCR     Status: None   Collection Time: 09/26/17  4:32 PM  Result Value Ref Range Status   Adenovirus NOT DETECTED NOT DETECTED Final   Coronavirus 229E NOT DETECTED NOT DETECTED Final   Coronavirus HKU1 NOT DETECTED NOT DETECTED Final   Coronavirus NL63 NOT DETECTED NOT DETECTED Final   Coronavirus OC43 NOT DETECTED NOT DETECTED Final   Metapneumovirus NOT DETECTED NOT DETECTED Final   Rhinovirus / Enterovirus NOT DETECTED NOT DETECTED Final   Influenza A NOT DETECTED NOT DETECTED Final   Influenza B NOT DETECTED NOT DETECTED Final   Parainfluenza Virus 1 NOT DETECTED NOT DETECTED Final   Parainfluenza Virus 2 NOT DETECTED NOT DETECTED Final   Parainfluenza Virus 3 NOT DETECTED NOT DETECTED Final   Parainfluenza Virus 4 NOT DETECTED NOT DETECTED Final   Respiratory Syncytial Virus NOT DETECTED NOT DETECTED Final   Bordetella pertussis NOT DETECTED NOT DETECTED Final   Chlamydophila pneumoniae NOT DETECTED NOT DETECTED Final   Mycoplasma pneumoniae NOT DETECTED NOT DETECTED Final         Radiology Studies: No results found.      Scheduled Meds: . amLODipine  2.5 mg Oral QHS  . aspirin EC  81 mg Oral Daily  . carvedilol  3.125 mg Oral BID  . cholecalciferol  1,000 Units Oral Daily  . enoxaparin (LOVENOX) injection  40 mg Subcutaneous Q24H  . feeding supplement (ENSURE ENLIVE)  237 mL Oral BID BM  . loratadine  10 mg Oral Q lunch  . mouth rinse  15 mL Mouth Rinse BID  . pantoprazole  40 mg Oral Daily  . pravastatin  40 mg Oral QHS  . scopolamine  1 patch Transdermal Q72H  . sodium chloride  1 spray Each Nare BID  . tacrolimus  1 mg Oral BID  . triamcinolone  2 spray Nasal Daily  . vitamin C  500 mg Oral BID   Continuous Infusions: . sodium chloride       LOS: 4 days     Cordelia Poche,  MD [facility] 09/29/2017, 2:16 PM Pager: [phone]  If 7PM-7AM, please contact night-coverage [email or web address] Password [email or web address] 09/29/2017, 2:16 PM

## 2017-09-30 ENCOUNTER — Inpatient Hospital Stay (HOSPITAL_COMMUNITY): Payer: Medicare Other

## 2017-09-30 DIAGNOSIS — M79609 Pain in unspecified limb: Secondary | ICD-10-CM

## 2017-09-30 DIAGNOSIS — Z66 Do not resuscitate: Secondary | ICD-10-CM

## 2017-09-30 DIAGNOSIS — Z515 Encounter for palliative care: Secondary | ICD-10-CM

## 2017-09-30 DIAGNOSIS — R531 Weakness: Secondary | ICD-10-CM

## 2017-09-30 LAB — CBC
HEMATOCRIT: 37.7 % — AB (ref 39.0–52.0)
Hemoglobin: 12 g/dL — ABNORMAL LOW (ref 13.0–17.0)
MCH: 29.3 pg (ref 26.0–34.0)
MCHC: 31.8 g/dL (ref 30.0–36.0)
MCV: 92 fL (ref 78.0–100.0)
PLATELETS: 167 10*3/uL (ref 150–400)
RBC: 4.1 MIL/uL — ABNORMAL LOW (ref 4.22–5.81)
RDW: 12.8 % (ref 11.5–15.5)
WBC: 7 10*3/uL (ref 4.0–10.5)

## 2017-09-30 LAB — CULTURE, BLOOD (ROUTINE X 2)
CULTURE: NO GROWTH
Special Requests: ADEQUATE

## 2017-09-30 LAB — BLOOD CULTURE ID PANEL (REFLEXED)
ACINETOBACTER BAUMANNII: NOT DETECTED
CANDIDA ALBICANS: NOT DETECTED
CANDIDA GLABRATA: NOT DETECTED
CANDIDA TROPICALIS: NOT DETECTED
Candida krusei: NOT DETECTED
Candida parapsilosis: NOT DETECTED
ENTEROBACTER CLOACAE COMPLEX: NOT DETECTED
ENTEROBACTERIACEAE SPECIES: NOT DETECTED
ENTEROCOCCUS SPECIES: NOT DETECTED
Escherichia coli: NOT DETECTED
Haemophilus influenzae: NOT DETECTED
KLEBSIELLA OXYTOCA: NOT DETECTED
KLEBSIELLA PNEUMONIAE: NOT DETECTED
LISTERIA MONOCYTOGENES: NOT DETECTED
Neisseria meningitidis: NOT DETECTED
Proteus species: NOT DETECTED
Pseudomonas aeruginosa: NOT DETECTED
STREPTOCOCCUS AGALACTIAE: NOT DETECTED
STREPTOCOCCUS PYOGENES: NOT DETECTED
Serratia marcescens: NOT DETECTED
Staphylococcus aureus (BCID): NOT DETECTED
Staphylococcus species: NOT DETECTED
Streptococcus pneumoniae: NOT DETECTED
Streptococcus species: NOT DETECTED

## 2017-09-30 LAB — MAGNESIUM: MAGNESIUM: 1.7 mg/dL (ref 1.7–2.4)

## 2017-09-30 MED ORDER — MAGNESIUM SULFATE 2 GM/50ML IV SOLN
2.0000 g | Freq: Once | INTRAVENOUS | Status: AC
  Administered 2017-09-30: 2 g via INTRAVENOUS
  Filled 2017-09-30: qty 50

## 2017-09-30 MED ORDER — MIRTAZAPINE 15 MG PO TABS
15.0000 mg | ORAL_TABLET | Freq: Every day | ORAL | Status: DC
  Administered 2017-09-30: 15 mg via ORAL
  Filled 2017-09-30: qty 1

## 2017-09-30 NOTE — Progress Notes (Signed)
PROGRESS NOTE    Darin Vega  DVV:[id number] DOB: 03-May-1956 DOA: 09/25/2017 PCP: Leanna Battles, MD   Brief Narrative: Darin Vega is a 62 y.o. malewith medical history significantfor orthotopic heart transplant 2005 (followed by [facility] cardiology), Fahr disease with progressive neurologic decline (followed by [facility] neurology),chronic kidney disease 3, GERD, syncope and collapse (chronic and recurrent), hypertension, who presents to the Emergency Department chief complaint syncope. Information is gathered from wife. Reportedly his wife found him on morning of admission, slumped over his walker. He was unresponsive. EMS was called and found his blood pressure was 70 systolic, heart rate 626. He is provided with 1 L IV fluids. He was also noted to be hypoxic and placed on a nonrebreather mask. He got to the emergency department and his oxygen saturation level was greater than 90%. He was provided with IV fluids and antibiotics and admitted to stepdown unit for presumptive severe sepsis.      Assessment & Plan:   Principal Problem:   Acute encephalopathy Active Problems:   CKD (chronic kidney disease) stage 3, GFR 30-59 ml/min (HCC)   SIRS (systemic inflammatory response syndrome) (HCC)   Acute respiratory failure (HCC)   Elevated lactic acid level   Hypothermia   Tachycardia   Palliative care by specialist   DNR (do not resuscitate)   Weakness generalized   Syncope Chronic issue. Secondary to vasodepressor syncope. S/p loop recorder.  SIRS Unknown source. Patient presented with hypotension, tachypnea, elevated lactic acid. Initially febrile with no white count. Unsure if this was secondary to infectious source vs other etiology. -Discontinue Zosyn since no source and likely contributing to diarrhea.  Essential hypertension -Continue Coreg and amlodipine  Acute respiratory failure with hypoxia Weaned to room air. Unknown etiology.  Hyperlipidemia -Continued  Pravachol  History of orthotopic heart transplant Follows at [facility] -Continue prograf  Diarrhea Chronic. Appears to have improved. -Continue Linzess  Chronic kidney disease Stable.  Fahr's disease Follows at [facility] -PT/OT/SLP -Palliative care consulted for goals of care  Chronic normocytic anemia Stable.  Hypokalemia Supplementation  Positive blood culture Likely contaminant. Gram positive rods. Patient without evidence of infection currently.  -Repeat blood cultures  Left thigh pain Low suspicion of DVT but will get LE duplex  Hypomagnesemia -replete   DVT prophylaxis: Lovenox Code Status: DNR Family Communication: Wife at bedside Disposition Plan: Discharge to SNF when bed available.   Consultants:   Palliative care medicine  Procedures:   None  Antimicrobials:  Zosyn  Vancomycin   Subjective: One bowel movement yesterday  Objective: Vitals:   09/30/17 0529 09/30/17 0800 09/30/17 1027 09/30/17 1200  BP: 125/89 129/89  (!) 145/89  Pulse: 97 (!) 104  (!) 102  Resp: 16     Temp: 97.9 F (36.6 C)  97.9 F (36.6 C) (!) 97.5 F (36.4 C)  TempSrc: Oral  Oral Oral  SpO2: 93% 95%  98%  Weight:      Height:        Intake/Output Summary (Last 24 hours) at 09/30/2017 1706 Last data filed at 09/30/2017 1500 Gross per 24 hour  Intake 290 ml  Output 425 ml  Net -135 ml   Filed Weights   09/25/17 1814 09/26/17 0400  Weight: 69.6 kg (153 lb 7 oz) 69.5 kg (153 lb 4.8 oz)    Examination:  General exam: Appears calm and comfortable. Respiratory system: Clear to auscultation but diminished. Respiratory effort normal. Cardiovascular system: S1 & S2 heard, RRR. No murmurs, rubs, gallops or clicks.  Gastrointestinal system: Abdomen is mildly distended, soft and nontender. No organomegaly or masses felt. Normal bowel sounds heard. Central nervous system: Alert and oriented. CN intact Extremities: No edema. No calf tenderness. No erythema of his left  thigh or point tenderness Skin: No cyanosis. No rashes Psychiatry: Judgement and insight appear normal. Mood & affect appropriate.     Data Reviewed: I have personally reviewed following labs and imaging studies  CBC: Recent Labs  Lab 09/25/17 0958 09/26/17 0202 09/27/17 0342 09/28/17 0219 09/30/17 0833  WBC 10.0 9.3 6.9 7.1 7.0  NEUTROABS 5.5  --   --   --   --   HGB 14.0 11.1* 11.1* 11.3* 12.0*  HCT 42.9 35.5* 34.6* 35.7* 37.7*  MCV 92.7 92.9 93.3 92.0 92.0  PLT 229 157 152 158 631   Basic Metabolic Panel: Recent Labs  Lab 09/25/17 0958 09/26/17 0202 09/27/17 0342 09/28/17 0219 09/30/17 0833  NA 140 140 140 140  --   K 3.5 4.1 3.4* 3.6  --   CL 108 109 107 107  --   CO2 22 24 22 25   --   GLUCOSE 182* 102* 88 93  --   BUN 29* 29* 19 18  --   CREATININE 2.07* 2.06* 2.01* 2.01*  --   CALCIUM 8.7* 8.1* 8.6* 8.8*  --   MG  --   --   --  1.4* 1.7   GFR: Estimated Creatinine Clearance: 37 mL/min (A) (by C-G formula based on SCr of 2.01 mg/dL (H)). Liver Function Tests: Recent Labs  Lab 09/25/17 0958  AST 24  ALT 11*  ALKPHOS 55  BILITOT 1.2  PROT 6.8  ALBUMIN 3.1*   No results for input(s): LIPASE, AMYLASE in the last 168 hours. No results for input(s): AMMONIA in the last 168 hours. Coagulation Profile: Recent Labs  Lab 09/25/17 0958  INR 1.08   Cardiac Enzymes: Recent Labs  Lab 09/25/17 1400 09/25/17 1910 09/26/17 0202  TROPONINI 0.04* <0.03 <0.03   BNP (last 3 results) No results for input(s): PROBNP in the last 8760 hours. HbA1C: No results for input(s): HGBA1C in the last 72 hours. CBG: Recent Labs  Lab 09/25/17 0951  GLUCAP 171*   Lipid Profile: No results for input(s): CHOL, HDL, LDLCALC, TRIG, CHOLHDL, LDLDIRECT in the last 72 hours. Thyroid Function Tests: No results for input(s): TSH, T4TOTAL, FREET4, T3FREE, THYROIDAB in the last 72 hours. Anemia Panel: No results for input(s): VITAMINB12, FOLATE, FERRITIN, TIBC, IRON,  RETICCTPCT in the last 72 hours. Sepsis Labs: Recent Labs  Lab 09/25/17 1000 09/25/17 1232  LATICACIDVEN 3.99* 2.12*    Recent Results (from the past 240 hour(s))  Culture, blood (Routine x 2)     Status: None (Preliminary result)   Collection Time: 09/25/17  9:58 AM  Result Value Ref Range Status   Specimen Description BLOOD LEFT ANTECUBITAL  Final   Special Requests   Final    BOTTLES DRAWN AEROBIC AND ANAEROBIC Blood Culture adequate volume   Culture  Setup Time   Final    GRAM POSITIVE RODS BLOOD ANAEROBIC BOTTLE CRITICAL RESULT CALLED TO, READ BACK BY AND VERIFIED WITH: T. RUDISILL,PHARMD [id number] 09/30/2017 T. TYSOR    Culture GRAM POSITIVE RODS  Final   Report Status PENDING  Incomplete  Blood Culture ID Panel (Reflexed)     Status: None   Collection Time: 09/25/17  9:58 AM  Result Value Ref Range Status   Enterococcus species NOT DETECTED NOT DETECTED Final   Listeria  monocytogenes NOT DETECTED NOT DETECTED Final   Staphylococcus species NOT DETECTED NOT DETECTED Final   Staphylococcus aureus NOT DETECTED NOT DETECTED Final   Streptococcus species NOT DETECTED NOT DETECTED Final   Streptococcus agalactiae NOT DETECTED NOT DETECTED Final   Streptococcus pneumoniae NOT DETECTED NOT DETECTED Final   Streptococcus pyogenes NOT DETECTED NOT DETECTED Final   Acinetobacter baumannii NOT DETECTED NOT DETECTED Final   Enterobacteriaceae species NOT DETECTED NOT DETECTED Final   Enterobacter cloacae complex NOT DETECTED NOT DETECTED Final   Escherichia coli NOT DETECTED NOT DETECTED Final   Klebsiella oxytoca NOT DETECTED NOT DETECTED Final   Klebsiella pneumoniae NOT DETECTED NOT DETECTED Final   Proteus species NOT DETECTED NOT DETECTED Final   Serratia marcescens NOT DETECTED NOT DETECTED Final   Haemophilus influenzae NOT DETECTED NOT DETECTED Final   Neisseria meningitidis NOT DETECTED NOT DETECTED Final   Pseudomonas aeruginosa NOT DETECTED NOT DETECTED Final   Candida  albicans NOT DETECTED NOT DETECTED Final   Candida glabrata NOT DETECTED NOT DETECTED Final   Candida krusei NOT DETECTED NOT DETECTED Final   Candida parapsilosis NOT DETECTED NOT DETECTED Final   Candida tropicalis NOT DETECTED NOT DETECTED Final  Culture, blood (Routine x 2)     Status: None   Collection Time: 09/25/17 11:17 AM  Result Value Ref Range Status   Specimen Description BLOOD RIGHT FOREARM  Final   Special Requests   Final    BOTTLES DRAWN AEROBIC AND ANAEROBIC Blood Culture adequate volume   Culture NO GROWTH 5 DAYS  Final   Report Status 09/30/2017 FINAL  Final  Gastrointestinal Panel by PCR , Stool     Status: None   Collection Time: 09/25/17 11:52 AM  Result Value Ref Range Status   Campylobacter species NOT DETECTED NOT DETECTED Final   Plesimonas shigelloides NOT DETECTED NOT DETECTED Final   Salmonella species NOT DETECTED NOT DETECTED Final   Yersinia enterocolitica NOT DETECTED NOT DETECTED Final   Vibrio species NOT DETECTED NOT DETECTED Final   Vibrio cholerae NOT DETECTED NOT DETECTED Final   Enteroaggregative E coli (EAEC) NOT DETECTED NOT DETECTED Final   Enteropathogenic E coli (EPEC) NOT DETECTED NOT DETECTED Final   Enterotoxigenic E coli (ETEC) NOT DETECTED NOT DETECTED Final   Shiga like toxin producing E coli (STEC) NOT DETECTED NOT DETECTED Final   Shigella/Enteroinvasive E coli (EIEC) NOT DETECTED NOT DETECTED Final   Cryptosporidium NOT DETECTED NOT DETECTED Final   Cyclospora cayetanensis NOT DETECTED NOT DETECTED Final   Entamoeba histolytica NOT DETECTED NOT DETECTED Final   Giardia lamblia NOT DETECTED NOT DETECTED Final   Adenovirus F40/41 NOT DETECTED NOT DETECTED Final   Astrovirus NOT DETECTED NOT DETECTED Final   Norovirus GI/GII NOT DETECTED NOT DETECTED Final   Rotavirus A NOT DETECTED NOT DETECTED Final   Sapovirus (I, II, IV, and V) NOT DETECTED NOT DETECTED Final    Comment: Performed at [facility], [address] [place], [place] [address]  C difficile quick scan w PCR reflex     Status: None   Collection Time: 09/25/17 11:52 AM  Result Value Ref Range Status   C Diff antigen NEGATIVE NEGATIVE Final   C Diff toxin NEGATIVE NEGATIVE Final   C Diff interpretation No C. difficile detected.  Final  Respiratory Panel by PCR     Status: None   Collection Time: 09/26/17  4:32 PM  Result Value Ref Range Status   Adenovirus NOT DETECTED NOT DETECTED  Final   Coronavirus 229E NOT DETECTED NOT DETECTED Final   Coronavirus HKU1 NOT DETECTED NOT DETECTED Final   Coronavirus NL63 NOT DETECTED NOT DETECTED Final   Coronavirus OC43 NOT DETECTED NOT DETECTED Final   Metapneumovirus NOT DETECTED NOT DETECTED Final   Rhinovirus / Enterovirus NOT DETECTED NOT DETECTED Final   Influenza A NOT DETECTED NOT DETECTED Final   Influenza B NOT DETECTED NOT DETECTED Final   Parainfluenza Virus 1 NOT DETECTED NOT DETECTED Final   Parainfluenza Virus 2 NOT DETECTED NOT DETECTED Final   Parainfluenza Virus 3 NOT DETECTED NOT DETECTED Final   Parainfluenza Virus 4 NOT DETECTED NOT DETECTED Final   Respiratory Syncytial Virus NOT DETECTED NOT DETECTED Final   Bordetella pertussis NOT DETECTED NOT DETECTED Final   Chlamydophila pneumoniae NOT DETECTED NOT DETECTED Final   Mycoplasma pneumoniae NOT DETECTED NOT DETECTED Final         Radiology Studies: No results found.      Scheduled Meds: . amLODipine  2.5 mg Oral QHS  . aspirin EC  81 mg Oral Daily  . carvedilol  3.125 mg Oral BID  . cholecalciferol  1,000 Units Oral Daily  . enoxaparin (LOVENOX) injection  40 mg Subcutaneous Q24H  . feeding supplement (ENSURE ENLIVE)  237 mL Oral BID BM  . loratadine  10 mg Oral Q lunch  . mouth rinse  15 mL Mouth Rinse BID  . mirtazapine  15 mg Oral QHS  . pantoprazole  40 mg Oral Daily  . pravastatin  40 mg Oral QHS  . scopolamine  1 patch Transdermal Q72H  . sodium chloride  1 spray Each Nare BID  . tacrolimus  1 mg  Oral BID  . triamcinolone  2 spray Nasal Daily  . vitamin C  500 mg Oral BID   Continuous Infusions: . sodium chloride       LOS: 5 days     Cordelia Poche, MD [facility] 09/30/2017, 5:06 PM Pager: [phone]  If 7PM-7AM, please contact night-coverage [email or web address] Password [facility] 09/30/2017, 5:06 PM

## 2017-09-30 NOTE — Progress Notes (Signed)
PHARMACY - PHYSICIAN COMMUNICATION CRITICAL VALUE ALERT - BLOOD CULTURE IDENTIFICATION (BCID)  Darin Vega is an 62 y.o. male who presented to [facility] on 09/25/2017 with a chief complaint of unresponsiveness and presumptive severe sepsis.  Assessment:  Anaerobic bottle positive for Gram + rods in 1/4 bottles, no fever, WBC WNL, likely contaminant  Name of physician (or Provider) Contacted: Bodenheimer  Current antibiotics: none  Changes to prescribed antibiotics recommended: Continue to follow clinical progression and the need to restart antibiotic therapy.   Results for orders placed or performed during the hospital encounter of 09/25/17  Blood Culture ID Panel (Reflexed) (Collected: 09/25/2017  9:58 AM)  Result Value Ref Range   Enterococcus species NOT DETECTED NOT DETECTED   Listeria monocytogenes NOT DETECTED NOT DETECTED   Staphylococcus species NOT DETECTED NOT DETECTED   Staphylococcus aureus NOT DETECTED NOT DETECTED   Streptococcus species NOT DETECTED NOT DETECTED   Streptococcus agalactiae NOT DETECTED NOT DETECTED   Streptococcus pneumoniae NOT DETECTED NOT DETECTED   Streptococcus pyogenes NOT DETECTED NOT DETECTED   Acinetobacter baumannii NOT DETECTED NOT DETECTED   Enterobacteriaceae species NOT DETECTED NOT DETECTED   Enterobacter cloacae complex NOT DETECTED NOT DETECTED   Escherichia coli NOT DETECTED NOT DETECTED   Klebsiella oxytoca NOT DETECTED NOT DETECTED   Klebsiella pneumoniae NOT DETECTED NOT DETECTED   Proteus species NOT DETECTED NOT DETECTED   Serratia marcescens NOT DETECTED NOT DETECTED   Haemophilus influenzae NOT DETECTED NOT DETECTED   Neisseria meningitidis NOT DETECTED NOT DETECTED   Pseudomonas aeruginosa NOT DETECTED NOT DETECTED   Candida albicans NOT DETECTED NOT DETECTED   Candida glabrata NOT DETECTED NOT DETECTED   Candida krusei NOT DETECTED NOT DETECTED   Candida parapsilosis NOT DETECTED NOT DETECTED   Candida tropicalis NOT  DETECTED NOT DETECTED    [name] 09/30/2017  3:50 AM

## 2017-09-30 NOTE — Care Management Note (Signed)
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  Spoke with pt and wife at bedside for ?home hospice needs- per conversation pt was with [facility] for home hospice services until Oct. Of 2018 when services were revoked due to non coverage under hospice for pt's Lingraphica speech assist device that he has as a trial through [employer]. Pt is hopeful for a permanent device and has an appointment on Jan 22 regarding this. Pt has been doing PT/OT/ST as an outpt at one point last year and was to have f/u with PC through [facility] but wife states that they have not heard from anyone- pt and wife are interested in other Hospice Agencies that may can offer needed services if they will cover the speech device that pt needs- Wife is overwhelmed with pt's care at home- and states she can not continue to care for him with things like they are- discussed options such as private duty however wife reports this in not an option financially for them. Discussed STSNF vs Long term care- they are also interested in speaking to [facility] while they are here for [place]. Will provide pt/wife with list of other Hospice providers. Pt could also benefit from PT/OT evals to assist with recommendations for safe transition.     Expected Discharge Date:                  Expected Discharge Plan:     In-House Referral:     Discharge planning Services  CM Consult  Post Acute Care Choice:    Choice offered to:     DME Arranged:    DME Agency:     HH Arranged:    HH Agency:     Status of Service:  In process, will continue to  follow  If discussed at Long Length of Stay Meetings, dates discussed:    Discharge Disposition: skilled facility   Additional Comments:  09/29/17- 1400- Marvetta Gibbons RN, CM- PC has met with pt and wife at bedside- Bambi with [facility] has also been into meet with them per wife request and has a signed agreement that [facility] will follow pt in SNF for further needs once pt returns home. CSW has been consulted for SNF placement and pt has received insurance auth and has a bed at [facility] per CSW MD plans for discharge on 1/12. Have notified Bambi with [facility] of discharge plan- she will f/u with pt and wife while at SNF.   Dawayne Patricia, RN 09/30/2017, 2:55 PM

## 2017-09-30 NOTE — [place] FL2 (Signed)
[place] MEDICAID FL2 LEVEL OF CARE SCREENING TOOL     IDENTIFICATION  Patient Name: Darin Vega Birthdate: 1956-07-17 Sex: male Admission Date (Current Location): 09/25/2017  [facility] and [place] Number:  Herbalist and Address:  The [place] [place] [address], [place], [place] [address]      Provider Number: [id number]  Attending Physician Name and Address:  Mariel Aloe, MD  Relative Name and Phone Number:  Ceylon Arenson, [phone]    Current Level of Care: Hospital Recommended Level of Care: [place] Prior Approval Number:    Date Approved/Denied:   PASRR Number: [id number]  Discharge Plan: SNF    Current Diagnoses: Patient Active Problem List   Diagnosis Date Noted  . Palliative care by specialist   . DNR (do not resuscitate)   . Weakness generalized   . SIRS (systemic inflammatory response syndrome) ([place]) 09/25/2017  . Acute encephalopathy 09/25/2017  . Acute respiratory failure ([place]) 09/25/2017  . Elevated lactic acid level 09/25/2017  . Hypothermia 09/25/2017  . Tachycardia 09/25/2017  . 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 Hypertension   . Multiple sclerosis ([place])   . Diarrhea 09/26/2013    Class: Chronic  . Syncope 09/25/2013  . CKD (chronic kidney disease) stage 3, GFR 30-59 ml/min (HCC) 09/25/2013  . History of heart transplant ([place]) 09/25/2013  . Fahr's disease ([place]) 09/25/2013  . Heart transplanted ([place])     Orientation RESPIRATION BLADDER Height & Weight     Self, Time, Situation, Place  Normal External catheter(condom cath) Weight: 153 lb 4.8 oz (69.5 kg) Height:  6\' 1"  (185.4 cm)  BEHAVIORAL SYMPTOMS/MOOD NEUROLOGICAL BOWEL NUTRITION STATUS      Continent Diet(regular)  AMBULATORY STATUS COMMUNICATION OF NEEDS Skin   Limited Assist Verbally(pt has difficulty getting the words out)                         Personal Care Assistance Level of Assistance  Bathing, Feeding, Dressing Bathing  Assistance: Limited assistance(mod assist ) Feeding assistance: Limited assistance Dressing Assistance: Limited assistance(mod assist)     Functional Limitations Info  Sight, Hearing, Speech Sight Info: Impaired(wears glasses) Hearing Info: Adequate Speech Info: Impaired(hard time getting words out)    SPECIAL CARE FACTORS FREQUENCY  PT (By licensed PT), OT (By licensed OT)     PT Frequency: 3x wk OT Frequency: 2x wk            Contractures Contractures Info: Not present    Additional Factors Info  Allergies, Code Status Code Status Info: DNR Allergies Info: ATROVENT IPRATROPIUM, OTHER, PEANUTS PEANUT OIL, FLUTICASONE, ACE INHIBITORS           Current Medications (09/30/2017):  This is the current hospital active medication list Current Facility-Administered Medications  Medication Dose Route Frequency Provider Last Rate Last Dose  . acetaminophen (TYLENOL) tablet 650 mg  650 mg Oral Q6H PRN Radene Gunning, NP   650 mg at 09/29/17 1318   Or  . acetaminophen (TYLENOL) suppository 650 mg  650 mg Rectal Q6H PRN Radene Gunning, NP      . amLODipine (NORVASC) tablet 2.5 mg  2.5 mg Oral QHS Dessa Phi, DO   2.5 mg at 09/29/17 2123  . aspirin EC tablet 81 mg  81 mg Oral Daily Dessa Phi, DO   81 mg at 09/29/17 1149  . carvedilol (COREG) tablet 3.125 mg  3.125 mg Oral BID Black, Karen M,  NP   3.125 mg at 09/29/17 2123  . cholecalciferol (VITAMIN D) tablet 1,000 Units  1,000 Units Oral Daily Dessa Phi, DO   1,000 Units at 09/29/17 1149  . diphenoxylate-atropine (LOMOTIL) 2.5-0.025 MG per tablet 1 tablet  1 tablet Oral BID BM PRN Radene Gunning, NP   1 tablet at 09/29/17 0748  . enoxaparin (LOVENOX) injection 40 mg  40 mg Subcutaneous Q24H Radene Gunning, NP   40 mg at 09/29/17 2147  . feeding supplement (ENSURE ENLIVE) (ENSURE ENLIVE) liquid 237 mL  237 mL Oral BID BM Dessa Phi, DO   237 mL at 09/28/17 0816  . HYDROcodone-acetaminophen (NORCO/VICODIN) 5-325 MG  per tablet 1 tablet  1 tablet Oral TID PRN Radene Gunning, NP   1 tablet at 09/29/17 2124  . loratadine (CLARITIN) tablet 10 mg  10 mg Oral Q lunch Radene Gunning, NP   10 mg at 09/29/17 1149  . MEDLINE mouth rinse  15 mL Mouth Rinse BID Dessa Phi, DO   15 mL at 09/29/17 2148  . ondansetron (ZOFRAN) tablet 4 mg  4 mg Oral Q6H PRN Radene Gunning, NP       Or  . ondansetron [facility]) injection 4 mg  4 mg Intravenous Q6H PRN Black, Karen M, NP      . pantoprazole (PROTONIX) EC tablet 40 mg  40 mg Oral Daily Radene Gunning, NP   40 mg at 09/29/17 1149  . polyvinyl alcohol (LIQUIFILM TEARS) 1.4 % ophthalmic solution 1 drop  1 drop Both Eyes PRN Dessa Phi, DO   1 drop at 09/28/17 2128  . pravastatin (PRAVACHOL) tablet 40 mg  40 mg Oral QHS Radene Gunning, NP   40 mg at 09/29/17 2123  . scopolamine (TRANSDERM-SCOP) 1 MG/3DAYS 1.5 mg  1 patch Transdermal Q72H [facility] [employer], MD   1.5 mg at 09/28/17 2138  . simethicone (MYLICON) chewable tablet 80 mg  80 mg Oral Q6H PRN Mariel Aloe, MD      . sodium chloride (OCEAN) 0.65 % nasal spray 1 spray  1 spray Each Nare BID Radene Gunning, NP   1 spray at 09/29/17 2123  . sodium chloride 0.9 % bolus 500 mL  500 mL Intravenous Once Black, Karen M, NP      . tacrolimus (PROGRAF) capsule 1 mg  1 mg Oral BID Radene Gunning, NP   1 mg at 09/29/17 2123  . triamcinolone (NASACORT) nasal inhaler 2 spray  2 spray Nasal Daily Dessa Phi, DO   2 spray at 09/28/17 0806  . vitamin C (ASCORBIC ACID) tablet 500 mg  500 mg Oral BID Dessa Phi, DO   500 mg at 09/29/17 2123     Discharge Medications: Please see discharge summary for a list of discharge medications.  Relevant Imaging Results:  Relevant Lab Results:   Additional Information [phone]  Wende Neighbors, LCSW

## 2017-09-30 NOTE — Progress Notes (Addendum)
*  Preliminary Results* Left lower extremity venous duplex completed. Left lower extremity is negative for deep vein thrombosis. There is evidence of left Baker's cyst.  09/30/2017 5:54 PM  Maudry Mayhew, BS, RVT, RDCS, RDMS

## 2017-09-30 NOTE — Progress Notes (Signed)
Wife is concerned about discharging without knowing why patient had hypotension on admit.

## 2017-09-30 NOTE — Progress Notes (Signed)
CSW spoke to patient and family at bedside. Family has decided on [facility] for patients short term rehab. CSW has contacted facility and made them aware of patients decision. Facility has bed available and will pursue authorization through patients insurance. CSW to continue to follow.   Rhea Pink, MSW,  [place]

## 2017-10-01 DIAGNOSIS — R000 Tachycardia, unspecified: Secondary | ICD-10-CM

## 2017-10-01 DIAGNOSIS — R531 Weakness: Secondary | ICD-10-CM

## 2017-10-01 DIAGNOSIS — N179 Acute kidney failure, unspecified: Secondary | ICD-10-CM

## 2017-10-01 DIAGNOSIS — E860 Dehydration: Secondary | ICD-10-CM

## 2017-10-01 MED ORDER — DIPHENHYDRAMINE HCL 25 MG PO CAPS
25.0000 mg | ORAL_CAPSULE | Freq: Once | ORAL | Status: AC
Start: 2017-10-01 — End: 2017-10-01
  Administered 2017-10-01: 25 mg via ORAL
  Filled 2017-10-01: qty 1

## 2017-10-01 MED ORDER — MIRTAZAPINE 7.5 MG PO TABS: 7.5000 mg | ORAL_TABLET | Freq: Every day | ORAL | Status: DC

## 2017-10-01 MED ORDER — ENSURE ENLIVE PO LIQD: 237.0000 mL | Freq: Two times a day (BID) | ORAL | Status: DC

## 2017-10-01 MED ORDER — MIRTAZAPINE 15 MG PO TABS: 7.5000 mg | ORAL_TABLET | Freq: Every day | ORAL | Status: DC

## 2017-10-01 MED ORDER — HYDROCODONE-ACETAMINOPHEN 5-325 MG PO TABS: 1.0000 | ORAL_TABLET | Freq: Three times a day (TID) | ORAL | 0 refills | Status: AC | PRN

## 2017-10-01 NOTE — Progress Notes (Signed)
Report given to receiving nurse on [facility] [place].

## 2017-10-01 NOTE — Discharge Summary (Signed)
Physician Discharge Summary  Darin Vega EXH:[id number] DOB: 28-Dec-1955 DOA: 09/25/2017  PCP: Leanna Battles, MD  Admit date: 09/25/2017 Discharge date: 10/01/2017  Admitted From: Home Disposition: SNF. Recommend Hospice referral  Recommendations for Outpatient Follow-up:  1. Please obtain BMP/CBC in one week 2. Please follow up on the following pending results: Blood culture final results, Tacrolimus level  Home Health: SNF Equipment/Devices: SNF  Discharge Condition: Stable CODE STATUS: DNR Diet recommendation: Regular diet   Brief/Interim Summary:  Admission HPI written by Dyanne Carrel, NP   Chief Complaint: syncope/altered mental status  HPI: Darin Vega is a 62 y.o. male with medical history significant for orthotopic heart transplant 2005, Fahr disease, chronic kidney disease, GERD, syncope and collapse, hypertension, presents to the emergency Department chief complaint syncope. Initial evaluation reveals hypothermia hypotension tachycardia hypoxia. [facility] are asked to admit.  Information is obtained from the chart and the patient. Reportedly his wife found him this morning slumped over his walker. He was unresponsive. EMS was called as her blood pressure was 70 systolic heart rate 381. He is provided with 1 L IV fluids. He was also noted to be hypoxic and placed on a nonrebreather mask. He got to the emergency department his oxygen saturation level was greater than 90%. He is provided with IV fluids antibiotics. At the time of admission he was alert and oriented hemodynamically stable his temperature did spike to 101.7 rectally remains tachycardic. Denies headache dizziness chest pain palpitation shortness of breath dizziness visual disturbances. He denies abdominal pain nausea vomiting. He does report just prior to that he experienced sudden sharp abdominal pain and had episode large amount of diarrhea. He denies cough fever chills recent travel or sick contacts.  He denies lower extremity edema.    ED Course: At the time of admission he is max temperature 101.7 rectally tachycardic with 117 blood pressure is stable he's not hypoxic he is alert and oriented.    Hospital course:  Syncope Chronic issue. Secondary to vasodepressor syncope. S/p loop recorder. No events on interrogation. Likely secondary to hypotension vs vasodepressor syndrome.  SIRS Unknown source. Patient presented with hypotension, tachypnea, elevated lactic acid. Initially febrile with no white count but is immunocompromised. Unsure if this was secondary to infectious source vs other etiology. Blood cultures (1/6) with 1/2 positive for gram positive cocci on day #5; this is likely a contaminant. Repeat blood cultures (1/11) with no growth prior to discharge. Patient without recurrent fevers. Initially treated with empiric Zosyn which was discontinued with patient being stable for 72 hours after.  Essential hypertension Continued Coreg and amlodipine  Acute respiratory failure with hypoxia Weaned to room air. Unknown etiology.  Hyperlipidemia Continued Pravachol  History of orthotopic heart transplant Follow[facility] at [facility]. Continued prograf  Diarrhea Chronic. Appears to have improved. -Continue Linzess  Chronic kidney disease Stable.  Fahr's disease Follows at [facility]. PT recommending SNF. Palliative care consulted.  Chronic normocytic anemia Stable.  Hypokalemia Supplementation given  Positive blood culture Likely contaminant. Gram positive rods. Patient without evidence of infection currently.  -Repeat blood cultures  Left thigh pain Low suspicion of DVT. LE duplex was negative for DVT or SVT  Hypomagnesemia Supplementation given.  Poor appetite Started on Remeron 15 mg but was very tired in the morning with itching as well. Doubt this is an allergic reaction. No hives. Decreased to 7.5 mg on discharge but recommend titrating up. Recommend  nutrition follow-up.    Discharge Diagnoses:  Principal Problem:   Acute encephalopathy  Active Problems:   CKD (chronic kidney disease) stage 3, GFR 30-59 ml/min (HCC)   SIRS (systemic inflammatory response syndrome) (HCC)   Acute respiratory failure (HCC)   Elevated lactic acid level   Hypothermia   Tachycardia   Palliative care by specialist   DNR (do not resuscitate)   Weakness generalized    Discharge Instructions   Allergies as of 10/01/2017      Reactions   Atrovent [ipratropium] Anaphylaxis   Other Anaphylaxis   Reaction to tree nuts   Peanuts [peanut Oil] Anaphylaxis   Fluticasone Nausea Only   Ace Inhibitors Cough      Medication List    STOP taking these medications   TYLENOL SINUS SEVERE PO     TAKE these medications   amLODipine 2.5 MG tablet Commonly known as:  NORVASC Take 2.5 mg by mouth at bedtime.   aspirin EC 81 MG tablet Take 81 mg by mouth daily.   BLINK TEARS OP Place 1 drop into both eyes daily as needed (dry eyes).   carvedilol 3.125 MG tablet Commonly known as:  COREG Take 3.125 mg by mouth 2 (two) times daily.   cholecalciferol 1000 units tablet Commonly known as:  VITAMIN D Take 1,000 Units by mouth daily.   diphenoxylate-atropine 2.5-0.025 MG tablet Commonly known as:  LOMOTIL Take 1 tablet by mouth 2 (two) times daily between meals as needed for diarrhea or loose stools.   EPINEPHrine 0.3 mg/0.3 mL Soaj injection Commonly known as:  EPI-PEN Inject 0.3 mLs (0.3 mg total) into the muscle once. What changed:    when to take this  reasons to take this   feeding supplement (ENSURE ENLIVE) Liqd Take 237 mLs by mouth 2 (two) times daily between meals.   HYDROcodone-acetaminophen 5-325 MG tablet Commonly known as:  NORCO/VICODIN Take 1 tablet by mouth 3 (three) times daily as needed (pain). For pain   loratadine 10 MG tablet Commonly known as:  CLARITIN Take 10 mg by mouth daily with lunch.   mirtazapine 7.5 MG  tablet Commonly known as:  REMERON Take 1 tablet (7.5 mg total) by mouth at bedtime.   multivitamin with minerals Tabs tablet Take 1 tablet by mouth 3 (three) times daily. [facility]   NASACORT ALLERGY 24HR 55 MCG/ACT Aero nasal inhaler Generic drug:  triamcinolone Place 2 sprays into the nose daily.   pantoprazole 40 MG tablet Commonly known as:  PROTONIX Take 40 mg by mouth daily.   pravastatin 80 MG tablet Commonly known as:  PRAVACHOL Take 40 mg by mouth at bedtime.   scopolamine 1 MG/3DAYS Commonly known as:  TRANSDERM-SCOP Place 1 patch onto the skin every 3 (three) days.   sodium chloride 0.65 % Soln nasal spray Commonly known as:  OCEAN Place 1 spray into both nostrils 2 (two) times daily.   tacrolimus 1 MG capsule Commonly known as:  PROGRAF Take 1 mg by mouth 2 (two) times daily.   vitamin C 500 MG tablet Commonly known as:  ASCORBIC ACID Take 500 mg by mouth 2 (two) times daily.      Contact information for after-discharge care    Destination    [facility] .   Service:  Skilled Nursing Contact information: [place] [address] [phone]             Allergies  Allergen Reactions  . Atrovent [Ipratropium] Anaphylaxis  . Other Anaphylaxis    Reaction to tree nuts  .  Peanuts [Peanut Oil] Anaphylaxis  . Fluticasone Nausea Only  . Ace Inhibitors Cough    Consultations:  Palliative care   Procedures/Studies: Ct Head Wo Contrast  Result Date: 09/25/2017 CLINICAL DATA:  Patient was found slumped over walker earlier this morning, unresponsive. EXAM: CT HEAD WITHOUT CONTRAST TECHNIQUE: Contiguous axial images were obtained from the base of the skull through the vertex without intravenous contrast. COMPARISON:  Multiple priors, most recent 04/08/2017. FINDINGS: Brain: Extensive brain parenchymal calcification of a symmetric nature, consistent with Fahr's disease. This involves the  supratentorial white matter, deep nuclei, brainstem, and cerebellum. An acute area of infarction or hemorrhage is not identified. Vascular: No hyperdense vessel. Skull: Calvarium intact. Sinuses/Orbits: No layering fluid. Other: Compared with priors, a similar appearance is noted. IMPRESSION: Extensive brain parenchymal calcification consistent with Fahr's disease. No acute findings are evident. Electronically Signed   By: Staci Righter M.D.   On: 09/25/2017 13:47   Dg Chest Portable 1 View  Result Date: 09/25/2017 CLINICAL DATA:  62 year old male with altered mental status EXAM: PORTABLE CHEST 1 VIEW COMPARISON:  Prior chest x-ray and chest CT 03/29/2016 FINDINGS: Stable cardiac and mediastinal contours. Implantable loop recorder projects over the left chest. Epicardial pacing leads are also visible. The lungs are clear. No focal airspace consolidation visualized. Chronic atelectasis in the inferior lingula and left lower lobe. Stable chronic bronchitic changes. No pneumothorax or pleural effusion. No acute osseous abnormality. IMPRESSION: No active disease. Electronically Signed   By: Jacqulynn Cadet M.D.   On: 09/25/2017 10:34   Dg Abd 2 Views  Result Date: 09/25/2017 CLINICAL DATA:  Found this morning slumped over his walker and unresponsive, abdominal pain and watery stools, history CHF, heart transplant, Fahr syndrome EXAM: ABDOMEN - 2 VIEW COMPARISON:  None FINDINGS: Epicardial pacing wires and loop recorder project over heart. Lung bases clear. Normal bowel gas pattern. No bowel dilatation or bowel wall thickening. No free air. Nonobstructing RIGHT renal calculus 8 x 5 mm diameter. No acute osseous findings. IMPRESSION: Nonobstructing 8 x 5 mm RIGHT renal calculus. Normal bowel gas pattern Electronically Signed   By: Lavonia Dana M.D.   On: 09/25/2017 13:47     Subjective: Diarrhea overnight. Itchy.  Discharge Exam: Vitals:   10/01/17 0554 10/01/17 0800  BP: 132/87 128/90  Pulse: 86 96   Resp: 17   Temp: 97.8 F (36.6 C) 97.6 F (36.4 C)  SpO2: 97% 93%   Vitals:   09/30/17 1600 09/30/17 2205 10/01/17 0554 10/01/17 0800  BP: (!) 127/103 140/89 132/87 128/90  Pulse: 98 (!) 109 86 96  Resp:   17   Temp:  98.3 F (36.8 C) 97.8 F (36.6 C) 97.6 F (36.4 C)  TempSrc:  Oral Oral Oral  SpO2: 95% 97% 97% 93%  Weight:      Height:        General: Pt is alert, awake, not in acute distress Cardiovascular: RRR, S1/S2 +, no rubs, no gallops Respiratory: CTA bilaterally, no wheezing, no rhonchi Abdominal: Soft, NT, ND, bowel sounds + Extremities: no edema, no cyanosis Psych: flat affect, depressed mood    The results of significant diagnostics from this hospitalization (including imaging, microbiology, ancillary and laboratory) are listed below for reference.     Microbiology: Recent Results (from the past 240 hour(s))  Culture, blood (Routine x 2)     Status: None (Preliminary result)   Collection Time: 09/25/17  9:58 AM  Result Value Ref Range Status   Specimen Description BLOOD LEFT  ANTECUBITAL  Final   Special Requests   Final    BOTTLES DRAWN AEROBIC AND ANAEROBIC Blood Culture adequate volume   Culture  Setup Time   Final    GRAM POSITIVE RODS BLOOD ANAEROBIC BOTTLE CRITICAL RESULT CALLED TO, READ BACK BY AND VERIFIED WITH: T. RUDISILL,PHARMD 0626 09/30/2017 T. TYSOR    Culture GRAM POSITIVE RODS  Final   Report Status PENDING  Incomplete  Blood Culture ID Panel (Reflexed)     Status: None   Collection Time: 09/25/17  9:58 AM  Result Value Ref Range Status   Enterococcus species NOT DETECTED NOT DETECTED Final   Listeria monocytogenes NOT DETECTED NOT DETECTED Final   Staphylococcus species NOT DETECTED NOT DETECTED Final   Staphylococcus aureus NOT DETECTED NOT DETECTED Final   Streptococcus species NOT DETECTED NOT DETECTED Final   Streptococcus agalactiae NOT DETECTED NOT DETECTED Final   Streptococcus pneumoniae NOT DETECTED NOT DETECTED Final    Streptococcus pyogenes NOT DETECTED NOT DETECTED Final   Acinetobacter baumannii NOT DETECTED NOT DETECTED Final   Enterobacteriaceae species NOT DETECTED NOT DETECTED Final   Enterobacter cloacae complex NOT DETECTED NOT DETECTED Final   Escherichia coli NOT DETECTED NOT DETECTED Final   Klebsiella oxytoca NOT DETECTED NOT DETECTED Final   Klebsiella pneumoniae NOT DETECTED NOT DETECTED Final   Proteus species NOT DETECTED NOT DETECTED Final   Serratia marcescens NOT DETECTED NOT DETECTED Final   Haemophilus influenzae NOT DETECTED NOT DETECTED Final   Neisseria meningitidis NOT DETECTED NOT DETECTED Final   Pseudomonas aeruginosa NOT DETECTED NOT DETECTED Final   Candida albicans NOT DETECTED NOT DETECTED Final   Candida glabrata NOT DETECTED NOT DETECTED Final   Candida krusei NOT DETECTED NOT DETECTED Final   Candida parapsilosis NOT DETECTED NOT DETECTED Final   Candida tropicalis NOT DETECTED NOT DETECTED Final  Culture, blood (Routine x 2)     Status: None   Collection Time: 09/25/17 11:17 AM  Result Value Ref Range Status   Specimen Description BLOOD RIGHT FOREARM  Final   Special Requests   Final    BOTTLES DRAWN AEROBIC AND ANAEROBIC Blood Culture adequate volume   Culture NO GROWTH 5 DAYS  Final   Report Status 09/30/2017 FINAL  Final  Gastrointestinal Panel by PCR , Stool     Status: None   Collection Time: 09/25/17 11:52 AM  Result Value Ref Range Status   Campylobacter species NOT DETECTED NOT DETECTED Final   Plesimonas shigelloides NOT DETECTED NOT DETECTED Final   Salmonella species NOT DETECTED NOT DETECTED Final   Yersinia enterocolitica NOT DETECTED NOT DETECTED Final   Vibrio species NOT DETECTED NOT DETECTED Final   Vibrio cholerae NOT DETECTED NOT DETECTED Final   Enteroaggregative E coli (EAEC) NOT DETECTED NOT DETECTED Final   Enteropathogenic E coli (EPEC) NOT DETECTED NOT DETECTED Final   Enterotoxigenic E coli (ETEC) NOT DETECTED NOT DETECTED Final    Shiga like toxin producing E coli (STEC) NOT DETECTED NOT DETECTED Final   Shigella/Enteroinvasive E coli (EIEC) NOT DETECTED NOT DETECTED Final   Cryptosporidium NOT DETECTED NOT DETECTED Final   Cyclospora cayetanensis NOT DETECTED NOT DETECTED Final   Entamoeba histolytica NOT DETECTED NOT DETECTED Final   Giardia lamblia NOT DETECTED NOT DETECTED Final   Adenovirus F40/41 NOT DETECTED NOT DETECTED Final   Astrovirus NOT DETECTED NOT DETECTED Final   Norovirus GI/GII NOT DETECTED NOT DETECTED Final   Rotavirus A NOT DETECTED NOT DETECTED Final   Sapovirus (I, II,  IV, and V) NOT DETECTED NOT DETECTED Final    Comment: Performed at [facility], [address] [place], [place] [address]  C difficile quick scan w PCR reflex     Status: None   Collection Time: 09/25/17 11:52 AM  Result Value Ref Range Status   C Diff antigen NEGATIVE NEGATIVE Final   C Diff toxin NEGATIVE NEGATIVE Final   C Diff interpretation No C. difficile detected.  Final  Respiratory Panel by PCR     Status: None   Collection Time: 09/26/17  4:32 PM  Result Value Ref Range Status   Adenovirus NOT DETECTED NOT DETECTED Final   Coronavirus 229E NOT DETECTED NOT DETECTED Final   Coronavirus HKU1 NOT DETECTED NOT DETECTED Final   Coronavirus NL63 NOT DETECTED NOT DETECTED Final   Coronavirus OC43 NOT DETECTED NOT DETECTED Final   Metapneumovirus NOT DETECTED NOT DETECTED Final   Rhinovirus / Enterovirus NOT DETECTED NOT DETECTED Final   Influenza A NOT DETECTED NOT DETECTED Final   Influenza B NOT DETECTED NOT DETECTED Final   Parainfluenza Virus 1 NOT DETECTED NOT DETECTED Final   Parainfluenza Virus 2 NOT DETECTED NOT DETECTED Final   Parainfluenza Virus 3 NOT DETECTED NOT DETECTED Final   Parainfluenza Virus 4 NOT DETECTED NOT DETECTED Final   Respiratory Syncytial Virus NOT DETECTED NOT DETECTED Final   Bordetella pertussis NOT DETECTED NOT DETECTED Final   Chlamydophila pneumoniae NOT DETECTED NOT  DETECTED Final   Mycoplasma pneumoniae NOT DETECTED NOT DETECTED Final     Labs: BNP (last 3 results) No results for input(s): BNP in the last 8760 hours. Basic Metabolic Panel: Recent Labs  Lab 09/25/17 0958 09/26/17 0202 09/27/17 0342 09/28/17 0219 09/30/17 0833  NA 140 140 140 140  --   K 3.5 4.1 3.4* 3.6  --   CL 108 109 107 107  --   CO2 22 24 22 25   --   GLUCOSE 182* 102* 88 93  --   BUN 29* 29* 19 18  --   CREATININE 2.07* 2.06* 2.01* 2.01*  --   CALCIUM 8.7* 8.1* 8.6* 8.8*  --   MG  --   --   --  1.4* 1.7   Liver Function Tests: Recent Labs  Lab 09/25/17 0958  AST 24  ALT 11*  ALKPHOS 55  BILITOT 1.2  PROT 6.8  ALBUMIN 3.1*   No results for input(s): LIPASE, AMYLASE in the last 168 hours. No results for input(s): AMMONIA in the last 168 hours. CBC: Recent Labs  Lab 09/25/17 0958 09/26/17 0202 09/27/17 0342 09/28/17 0219 09/30/17 0833  WBC 10.0 9.3 6.9 7.1 7.0  NEUTROABS 5.5  --   --   --   --   HGB 14.0 11.1* 11.1* 11.3* 12.0*  HCT 42.9 35.5* 34.6* 35.7* 37.7*  MCV 92.7 92.9 93.3 92.0 92.0  PLT 229 157 152 158 167   Cardiac Enzymes: Recent Labs  Lab 09/25/17 1400 09/25/17 1910 09/26/17 0202  TROPONINI 0.04* <0.03 <0.03   BNP: Invalid input(s): POCBNP CBG: Recent Labs  Lab 09/25/17 0951  GLUCAP 171*   D-Dimer No results for input(s): DDIMER in the last 72 hours. Hgb A1c No results for input(s): HGBA1C in the last 72 hours. Lipid Profile No results for input(s): CHOL, HDL, LDLCALC, TRIG, CHOLHDL, LDLDIRECT in the last 72 hours. Thyroid function studies No results for input(s): TSH, T4TOTAL, T3FREE, THYROIDAB in the last 72 hours.  Invalid input(s): FREET3 Anemia work up No  results for input(s): VITAMINB12, FOLATE, FERRITIN, TIBC, IRON, RETICCTPCT in the last 72 hours. Urinalysis    Component Value Date/Time   COLORURINE YELLOW 09/25/2017 1443   APPEARANCEUR HAZY (A) 09/25/2017 1443   LABSPEC 1.016 09/25/2017 1443   PHURINE  5.0 09/25/2017 1443   GLUCOSEU NEGATIVE 09/25/2017 1443   HGBUR NEGATIVE 09/25/2017 1443   BILIRUBINUR NEGATIVE 09/25/2017 1443   KETONESUR NEGATIVE 09/25/2017 1443   PROTEINUR 30 (A) 09/25/2017 1443   UROBILINOGEN 0.2 12/14/2014 1259   NITRITE NEGATIVE 09/25/2017 1443   LEUKOCYTESUR NEGATIVE 09/25/2017 1443   Sepsis Labs Invalid input(s): PROCALCITONIN,  WBC,  LACTICIDVEN Microbiology Recent Results (from the past 240 hour(s))  Culture, blood (Routine x 2)     Status: None (Preliminary result)   Collection Time: 09/25/17  9:58 AM  Result Value Ref Range Status   Specimen Description BLOOD LEFT ANTECUBITAL  Final   Special Requests   Final    BOTTLES DRAWN AEROBIC AND ANAEROBIC Blood Culture adequate volume   Culture  Setup Time   Final    GRAM POSITIVE RODS BLOOD ANAEROBIC BOTTLE CRITICAL RESULT CALLED TO, READ BACK BY AND VERIFIED WITH: T. RUDISILL,PHARMD 1610 09/30/2017 T. TYSOR    Culture GRAM POSITIVE RODS  Final   Report Status PENDING  Incomplete  Blood Culture ID Panel (Reflexed)     Status: None   Collection Time: 09/25/17  9:58 AM  Result Value Ref Range Status   Enterococcus species NOT DETECTED NOT DETECTED Final   Listeria monocytogenes NOT DETECTED NOT DETECTED Final   Staphylococcus species NOT DETECTED NOT DETECTED Final   Staphylococcus aureus NOT DETECTED NOT DETECTED Final   Streptococcus species NOT DETECTED NOT DETECTED Final   Streptococcus agalactiae NOT DETECTED NOT DETECTED Final   Streptococcus pneumoniae NOT DETECTED NOT DETECTED Final   Streptococcus pyogenes NOT DETECTED NOT DETECTED Final   Acinetobacter baumannii NOT DETECTED NOT DETECTED Final   Enterobacteriaceae species NOT DETECTED NOT DETECTED Final   Enterobacter cloacae complex NOT DETECTED NOT DETECTED Final   Escherichia coli NOT DETECTED NOT DETECTED Final   Klebsiella oxytoca NOT DETECTED NOT DETECTED Final   Klebsiella pneumoniae NOT DETECTED NOT DETECTED Final   Proteus species  NOT DETECTED NOT DETECTED Final   Serratia marcescens NOT DETECTED NOT DETECTED Final   Haemophilus influenzae NOT DETECTED NOT DETECTED Final   Neisseria meningitidis NOT DETECTED NOT DETECTED Final   Pseudomonas aeruginosa NOT DETECTED NOT DETECTED Final   Candida albicans NOT DETECTED NOT DETECTED Final   Candida glabrata NOT DETECTED NOT DETECTED Final   Candida krusei NOT DETECTED NOT DETECTED Final   Candida parapsilosis NOT DETECTED NOT DETECTED Final   Candida tropicalis NOT DETECTED NOT DETECTED Final  Culture, blood (Routine x 2)     Status: None   Collection Time: 09/25/17 11:17 AM  Result Value Ref Range Status   Specimen Description BLOOD RIGHT FOREARM  Final   Special Requests   Final    BOTTLES DRAWN AEROBIC AND ANAEROBIC Blood Culture adequate volume   Culture NO GROWTH 5 DAYS  Final   Report Status 09/30/2017 FINAL  Final  Gastrointestinal Panel by PCR , Stool     Status: None   Collection Time: 09/25/17 11:52 AM  Result Value Ref Range Status   Campylobacter species NOT DETECTED NOT DETECTED Final   Plesimonas shigelloides NOT DETECTED NOT DETECTED Final   Salmonella species NOT DETECTED NOT DETECTED Final   Yersinia enterocolitica NOT DETECTED NOT DETECTED Final   Vibrio  species NOT DETECTED NOT DETECTED Final   Vibrio cholerae NOT DETECTED NOT DETECTED Final   Enteroaggregative E coli (EAEC) NOT DETECTED NOT DETECTED Final   Enteropathogenic E coli (EPEC) NOT DETECTED NOT DETECTED Final   Enterotoxigenic E coli (ETEC) NOT DETECTED NOT DETECTED Final   Shiga like toxin producing E coli (STEC) NOT DETECTED NOT DETECTED Final   Shigella/Enteroinvasive E coli (EIEC) NOT DETECTED NOT DETECTED Final   Cryptosporidium NOT DETECTED NOT DETECTED Final   Cyclospora cayetanensis NOT DETECTED NOT DETECTED Final   Entamoeba histolytica NOT DETECTED NOT DETECTED Final   Giardia lamblia NOT DETECTED NOT DETECTED Final   Adenovirus F40/41 NOT DETECTED NOT DETECTED Final    Astrovirus NOT DETECTED NOT DETECTED Final   Norovirus GI/GII NOT DETECTED NOT DETECTED Final   Rotavirus A NOT DETECTED NOT DETECTED Final   Sapovirus (I, II, IV, and V) NOT DETECTED NOT DETECTED Final    Comment: Performed at [facility], [address] [place], [place] [address]  C difficile quick scan w PCR reflex     Status: None   Collection Time: 09/25/17 11:52 AM  Result Value Ref Range Status   C Diff antigen NEGATIVE NEGATIVE Final   C Diff toxin NEGATIVE NEGATIVE Final   C Diff interpretation No C. difficile detected.  Final  Respiratory Panel by PCR     Status: None   Collection Time: 09/26/17  4:32 PM  Result Value Ref Range Status   Adenovirus NOT DETECTED NOT DETECTED Final   Coronavirus 229E NOT DETECTED NOT DETECTED Final   Coronavirus HKU1 NOT DETECTED NOT DETECTED Final   Coronavirus NL63 NOT DETECTED NOT DETECTED Final   Coronavirus OC43 NOT DETECTED NOT DETECTED Final   Metapneumovirus NOT DETECTED NOT DETECTED Final   Rhinovirus / Enterovirus NOT DETECTED NOT DETECTED Final   Influenza A NOT DETECTED NOT DETECTED Final   Influenza B NOT DETECTED NOT DETECTED Final   Parainfluenza Virus 1 NOT DETECTED NOT DETECTED Final   Parainfluenza Virus 2 NOT DETECTED NOT DETECTED Final   Parainfluenza Virus 3 NOT DETECTED NOT DETECTED Final   Parainfluenza Virus 4 NOT DETECTED NOT DETECTED Final   Respiratory Syncytial Virus NOT DETECTED NOT DETECTED Final   Bordetella pertussis NOT DETECTED NOT DETECTED Final   Chlamydophila pneumoniae NOT DETECTED NOT DETECTED Final   Mycoplasma pneumoniae NOT DETECTED NOT DETECTED Final     Time coordinating discharge: Over 30 minutes  SIGNED:   Cordelia Poche, MD [facility] 10/01/2017, 11:49 AM Pager [phone]  If 7PM-7AM, please contact night-coverage [email or web address] Password [email or web address]

## 2017-10-01 NOTE — Clinical Social Work Placement (Signed)
   CLINICAL SOCIAL WORK PLACEMENT  NOTE  Date:  10/01/2017  Patient Details  Name: Darin Vega MRN: [id number] Date of Birth: 1955-11-01  Clinical Social Work is seeking post-discharge placement for this patient at the [place] level of care (*CSW will initial, date and re-position this form in  chart as items are completed):  Yes   Patient/family provided with [facility] Work Department's list of facilities offering this level of care within the geographic area requested by the patient (or if unable, by the patient's family).  Yes   Patient/family informed of their freedom to choose among providers that offer the needed level of care, that participate in Medicare, Medicaid or managed care program needed by the patient, have an available bed and are willing to accept the patient.  Yes   Patient/family informed of [facility]'s ownership interest in [facility] and [facility], as well as of the fact that they are under no obligation to receive care at these facilities.  PASRR submitted to EDS on 09/30/17     PASRR number received on 09/30/17     Existing PASRR number confirmed on       FL2 transmitted to all facilities in geographic area requested by pt/family on 09/30/17     FL2 transmitted to all facilities within larger geographic area on       Patient informed that his/her managed care company has contracts with or will negotiate with certain facilities, including the following:        Yes   Patient/family informed of bed offers received.  Patient chooses bed at [facility]     Physician recommends and patient chooses bed at      Patient to be transferred to [facility] on 10/01/17.  Patient to be transferred to facility by [facility]     Patient family notified on 10/01/17 of transfer.  Name of family member notified:  Brother at bedside. He will call patient's wife.     PHYSICIAN       Additional Comment:     _______________________________________________ Candie Chroman, LCSW 10/01/2017, 12:32 PM

## 2017-10-01 NOTE — Clinical Social Work Note (Signed)
[facility] is ready to accept patient. RN will call CSW when patient's wife arrives so transport can be arranged.  Dayton Scrape, [place]

## 2017-10-01 NOTE — Progress Notes (Signed)
Patient in a stable condition, IV removed, tele dc ccmd notified discharge instructions reviewed with patient and his wife at bedside,they verbalised understanding , their questions were answered, patient's wife verbalised that it is ok for the  patient to discharge to [facility] today but complained that she was not given enough time to prepare for her husband's discharge to [facility], MD notified, charge nurse also notified, no new orders received, charge nurse went into the room to listen to the patient's wife's concerns, patient transported to [facility] by [facility], patients, belonging removed from the room by his family, home meds returned to the patient's wife.

## 2017-10-01 NOTE — Progress Notes (Signed)
Report called to nurse Quillian Quince at [facility], awaiting patient's wife , she wanst to be here when patient will be transported to [facility]

## 2017-10-01 NOTE — Clinical Social Work Note (Signed)
CSW facilitated patient discharge including contacting patient family and facility to confirm patient discharge plans. Clinical information faxed to facility and family agreeable with plan. CSW arranged ambulance transport via [facility] to [facility]. RN to call report prior to discharge ([phone]).  CSW will sign off for now as social work intervention is no longer needed. Please consult us again if new needs arise.  Luisdaniel Kenton, CSW [phone]   

## 2017-10-02 LAB — CULTURE, BLOOD (ROUTINE X 2): SPECIAL REQUESTS: ADEQUATE

## 2017-10-03 LAB — TACROLIMUS LEVEL: Tacrolimus (FK506) - LabCorp: 6.5 ng/mL (ref 2.0–20.0)

## 2017-10-05 LAB — CULTURE, BLOOD (ROUTINE X 2)
Culture: NO GROWTH
Culture: NO GROWTH
Special Requests: ADEQUATE

## 2017-12-05 ENCOUNTER — Encounter: Payer: Self-pay | Admitting: Internal Medicine

## 2017-12-05 ENCOUNTER — Non-Acute Institutional Stay (SKILLED_NURSING_FACILITY): Payer: Medicare Other | Admitting: Internal Medicine

## 2017-12-05 DIAGNOSIS — F419 Anxiety disorder, unspecified: Secondary | ICD-10-CM | POA: Diagnosis not present

## 2017-12-05 DIAGNOSIS — G35 Multiple sclerosis: Secondary | ICD-10-CM

## 2017-12-05 DIAGNOSIS — Z941 Heart transplant status: Secondary | ICD-10-CM | POA: Diagnosis not present

## 2017-12-05 DIAGNOSIS — K219 Gastro-esophageal reflux disease without esophagitis: Secondary | ICD-10-CM

## 2017-12-05 DIAGNOSIS — G238 Other specified degenerative diseases of basal ganglia: Secondary | ICD-10-CM

## 2017-12-05 DIAGNOSIS — D849 Immunodeficiency, unspecified: Secondary | ICD-10-CM

## 2017-12-05 DIAGNOSIS — I10 Essential (primary) hypertension: Secondary | ICD-10-CM

## 2017-12-05 DIAGNOSIS — E785 Hyperlipidemia, unspecified: Secondary | ICD-10-CM | POA: Diagnosis not present

## 2017-12-05 DIAGNOSIS — D899 Disorder involving the immune mechanism, unspecified: Secondary | ICD-10-CM | POA: Diagnosis not present

## 2017-12-05 NOTE — Progress Notes (Signed)
: Provider:  Noah Delaine. Sheppard Coil, MD Location:  [place] Room Number: [phone] Place of Service:  SNF ([phone])  PCP: Leanna Battles, MD Patient Care Team: Leanna Battles, MD as PCP - General (Internal Medicine)  Extended Emergency Contact Information Primary Emergency Contact: Miklas,Angela Address: [address]          [place], [place] [address] [place] Phone: [phone] Mobile Phone: [phone] Relation: Spouse     Allergies: Atrovent [ipratropium]; Flu virus vaccine; Other; Peanuts [peanut oil]; Fluticasone; Cimetidine; and Ace inhibitors  Chief Complaint  Patient presents with  . New Admit To SNF    Admit to Facility     HPI: Patient is 62 y.o. male with history of heart transplant, with long-term use of immunosuppressant medication, Fahr's syndrome, multiple sclerosis, Parkinson's disease, hypertension, history of gastric ulcer, GERD, and chronic kidney disease who is admitted to skilled nursing facility for hospice respite care.  Patient was last hospitalized in January 2019 for SIRS.  Patient has no complaints and no acute returns at this time.  Past Medical History:  Diagnosis Date  . AKI (acute kidney injury) ([place]) 04/17/2013   unspecified, suspected 2/2 tagamet- stopped 04/17/13  . Allergic state   . Autonomic orthostatic hypotension 12/03/2015  . BCC (basal cell carcinoma of skin)   . Benign colon polyp 09/07/2011  . Cancer ([place])   . Cardiac device in situ 12/03/2015  . Cardiomyopathy, secondary ([place]) 1999   diagnosed by Dr.Peter Martinique, [facility]  . CHF (congestive heart failure) ([place]) 09/04/1998   "before heart transplant in 2005, ideopathic origin  . Chronic sinusitis 05/13/2014   unspecifed  . CKD (chronic kidney disease)   . Encounter for blood transfusion   . Environmental allergies   . Epistaxis 05/08/2015  . 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 Genetic defect   . GERD (gastroesophageal  reflux disease)   . Headache(784.0)    "usually have one q other day; attributed to Fahr's syndrome" (09/25/2013)  . Heart disease   . Heart replaced by transplant ([place]) 05/10/2004   09/07/2011  . Heart transplanted ([place]) 2005  . History of gastric ulcer   . History of kidney stones   . History of stomach ulcers    "before heart transplant"  . Hypertension   . Immunosuppression ([place]) 04/03/2012   unspecified   . Long-term use of immunosuppressant medication 04/03/2012  . Migraine    "don't get them often anymore" (09/25/2013)  . Migraine headache 1998   cluster migraine headaches  . Multiple sclerosis ([place])   . Neuro-degenerative disorders   . Neuropathy 2007   feet  . Parkinson disease ([place])    FAHRS Disease  . Peripheral neuropathy 05/04/2013  . Renal disorder    medication induced  . Syncope and collapse ~ 2010; 2011; 07/2013; 09/25/2013  . Tremor 05/13/2004   after heart transplant  . Verruca vulgaris 08/22/2013    Past Surgical History:  Procedure Laterality Date  . CARDIAC CATHETERIZATION  2005   After May 10, 2004 for follow-up re: "Heart Transplant"  . CARDIAC DEFIBRILLATOR PLACEMENT  10/2001; 06/2003   "took 2nd one out before heart transplant in 04/2004"  . CARDIAC SURGERY    . CATARACT EXTRACTION W/ INTRAOCULAR LENS  IMPLANT, BILATERAL Bilateral ?2009  . HEART TRANSPLANT  04/2004  . LOOP RECORDER IMPLANT  10/2015  . SKIN BIOPSY  03/2013   Dr. Wilhemina Bonito    Allergies as of 12/05/2017  Reactions   Atrovent [ipratropium] Anaphylaxis   Flu Virus Vaccine Diarrhea   Other reaction(s): Vomiting Received flu vaccine 2 years ago   Other Anaphylaxis   Reaction to tree nuts   Peanuts [peanut Oil] Anaphylaxis   Fluticasone Nausea Only   Cimetidine    Other reaction(s): Kidney Disorder Worsening renal function   Ace Inhibitors Cough      Medication List        Accurate as of 12/05/17 12:14 PM. Always use your most recent med list.          ALPRAZolam  0.5 MG tablet Commonly known as:  XANAX Take 0.5 mg by mouth 2 (two) times daily as needed for anxiety.   amLODipine 2.5 MG tablet Commonly known as:  NORVASC Take 2.5 mg by mouth at bedtime.   aspirin EC 81 MG tablet Take 81 mg by mouth daily.   atropine 1 % ophthalmic solution Place 4 drops under the tongue every 4 (four) hours as needed.   carvedilol 3.125 MG tablet Commonly known as:  COREG Take 3.125 mg by mouth 2 (two) times daily.   cholecalciferol 1000 units tablet Commonly known as:  VITAMIN D Take 1,000 Units by mouth daily.   diphenoxylate-atropine 2.5-0.025 MG tablet Commonly known as:  LOMOTIL Take 1 tablet by mouth 3 (three) times daily between meals as needed for diarrhea or loose stools.   EPINEPHrine 0.3 mg/0.3 mL Soaj injection Commonly known as:  EPI-PEN Inject 0.3 mg into the muscle as needed.   HYDROcodone-acetaminophen 5-325 MG tablet Commonly known as:  NORCO/VICODIN Take 1 tablet by mouth 3 (three) times daily as needed (pain). For pain   loratadine 10 MG tablet Commonly known as:  CLARITIN Take 10 mg by mouth daily with lunch.   MUCINEX MAXIMUM STRENGTH 1200 MG Tb12 Generic drug:  Guaifenesin Take 1 tablet by mouth every 12 (twelve) hours as needed.   multivitamin with minerals Tabs tablet Take 1 tablet by mouth daily. [facility] life essentials   NASACORT ALLERGY 24HR 55 MCG/ACT Aero nasal inhaler Generic drug:  triamcinolone Place 2 sprays into the nose daily.   ondansetron 4 MG tablet Commonly known as:  ZOFRAN Take 4 mg by mouth as needed for nausea or vomiting.   oxyCODONE-acetaminophen 5-325 MG tablet Commonly known as:  PERCOCET/ROXICET Take 1 tablet by mouth every 8 (eight) hours.   oxyCODONE-acetaminophen 5-325 MG tablet Commonly known as:  PERCOCET/ROXICET Take 1 tablet by mouth 3 (three) times daily as needed for severe pain.   pantoprazole 40 MG tablet Commonly known as:  PROTONIX Take 40 mg by mouth daily.     pravastatin 40 MG tablet Commonly known as:  PRAVACHOL Take 40 mg by mouth daily.   scopolamine 1 MG/3DAYS Commonly known as:  TRANSDERM-SCOP Place 1 patch onto the skin every 3 (three) days.   sodium chloride 0.65 % Soln nasal spray Commonly known as:  OCEAN Place 1 spray into both nostrils 2 (two) times daily.   tacrolimus 1 MG capsule Commonly known as:  PROGRAF Take 1 mg by mouth 2 (two) times daily.   vitamin C 500 MG tablet Commonly known as:  ASCORBIC ACID Take 500 mg by mouth 2 (two) times daily.       No orders of the defined types were placed in this encounter.   Immunization History  Administered Date(s) Administered  . Influenza,inj,Quad PF,6+ Mos 10/28/2015  . Tdap 10/28/2015    Social History   Tobacco Use  .  Smoking status: Never Smoker  . Smokeless tobacco: Never Used  Substance Use Topics  . Alcohol use: No    Family history is   Family History  Problem Relation Age of Onset  . Heart disease Mother   . Hypertension Mother   . Migraines Mother   . Seizures Mother   . Brain cancer Father   . Cancer Father   . Prostate cancer Father   . Kidney disease Brother   . Migraines Brother   . Neurologic Disorder Maternal Grandmother   . Heart disease Maternal Grandfather   . Heart disease Paternal Grandmother   . Cancer Paternal Grandfather   . Alzheimer's disease Paternal Uncle   . Cancer Paternal Uncle   . Cancer Paternal Uncle   . Skin cancer Paternal Uncle        non- melanoma      Review of Systems  DATA OBTAINED: from patient, nurse GENERAL:  no fevers, fatigue, appetite changes SKIN: No itching, or rash EYES: No eye pain, redness, discharge EARS: No earache, tinnitus, change in hearing NOSE: No congestion, drainage or bleeding  MOUTH/THROAT: No mouth or tooth pain, No sore throat RESPIRATORY: No cough, wheezing, SOB CARDIAC: No chest pain, palpitations, lower extremity edema  GI: No abdominal pain, No N/V/D or constipation,  No heartburn or reflux  GU: No dysuria, frequency or urgency, or incontinence  MUSCULOSKELETAL: No unrelieved bone/joint pain NEUROLOGIC: No headache, dizziness or focal weakness PSYCHIATRIC: No c/o anxiety or sadness   Vitals:   12/05/17 0933  BP: (!) 147/87  Pulse: 99  Resp: 18  Temp: (!) 97.1 F (36.2 C)  SpO2: 97%    SpO2 Readings from Last 1 Encounters:  12/05/17 97%   Body mass index is 20.23 kg/m.     Physical Exam  GENERAL APPEARANCE: Alert, conversant,  No acute distress, with walker.  SKIN: No diaphoresis rash HEAD: Normocephalic, atraumatic  EYES: Conjunctiva/lids clear. Pupils round, reactive. EOMs intact.  EARS: External exam WNL, canals clear. Hearing grossly normal.  NOSE: No deformity or discharge.  MOUTH/THROAT: Lips w/o lesions  RESPIRATORY: Breathing is even, unlabored. Lung sounds are clear   CARDIOVASCULAR: Heart RRR no murmurs, rubs or gallops. No peripheral edema.   GASTROINTESTINAL: Abdomen is soft, non-tender, not distended w/ normal bowel sounds. GENITOURINARY: Bladder non tender, not distended  MUSCULOSKELETAL: No abnormal joints or musculature NEUROLOGIC:  Cranial nerves 2-12 grossly intact. Moves all extremities  PSYCHIATRIC: Mood and affect appropriate to situation, flat affect no behavioral issues  Patient Active Problem List   Diagnosis Date Noted  . AKI (acute kidney injury) ([place])   . Dehydration   . Palliative care by specialist   . DNR (do not resuscitate)   . Weakness generalized   . SIRS (systemic inflammatory response syndrome) ([place]) 09/25/2017  . Acute encephalopathy 09/25/2017  . Acute respiratory failure ([place]) 09/25/2017  . Elevated lactic acid level 09/25/2017  . Hypothermia 09/25/2017  . Tachycardia 09/25/2017  . Fahr's syndrome ([place])   . Hypertension   . Multiple sclerosis ([place])   . Diarrhea 09/26/2013    Class: Chronic  . Syncope 09/25/2013  . CKD (chronic kidney disease) stage 3, GFR 30-59 ml/min (H[facility])  09/25/2013  . History of heart transplant ([place]) 09/25/2013  . Fahr's disease ([place]) 09/25/2013  . Heart transplanted [facility])       Labs reviewed: Basic Metabolic Panel:    Component Value Date/Time   NA 140 09/28/2017 0219   K 3.6 09/28/2017 0219   CL  107 09/28/2017 0219   CO2 25 09/28/2017 0219   GLUCOSE 93 09/28/2017 0219   BUN 18 09/28/2017 0219   CREATININE 2.01 (H) 09/28/2017 0219   CALCIUM 8.8 (L) 09/28/2017 0219   PROT 6.8 09/25/2017 0958   ALBUMIN 3.1 (L) 09/25/2017 0958   AST 24 09/25/2017 0958   ALT 11 (L) 09/25/2017 0958   ALKPHOS 55 09/25/2017 0958   BILITOT 1.2 09/25/2017 0958   GFRNONAA 34 (L) 09/28/2017 0219   GFRAA 39 (L) 09/28/2017 0219    Recent Labs    09/26/17 0202 09/27/17 0342 09/28/17 0219 09/30/17 0833  NA 140 140 140  --   K 4.1 3.4* 3.6  --   CL 109 107 107  --   CO2 24 22 25   --   GLUCOSE 102* 88 93  --   BUN 29* 19 18  --   CREATININE 2.06* 2.01* 2.01*  --   CALCIUM 8.1* 8.6* 8.8*  --   MG  --   --  1.4* 1.7   Liver Function Tests: Recent Labs    09/25/17 0958  AST 24  ALT 11*  ALKPHOS 55  BILITOT 1.2  PROT 6.8  ALBUMIN 3.1*   No results for input(s): LIPASE, AMYLASE in the last 8760 hours. No results for input(s): AMMONIA in the last 8760 hours. CBC: Recent Labs    09/25/17 0958  09/27/17 0342 09/28/17 0219 09/30/17 0833  WBC 10.0   < > 6.9 7.1 7.0  NEUTROABS 5.5  --   --   --   --   HGB 14.0   < > 11.1* 11.3* 12.0*  HCT 42.9   < > 34.6* 35.7* 37.7*  MCV 92.7   < > 93.3 92.0 92.0  PLT 229   < > 152 158 167   < > = values in this interval not displayed.   Lipid No results for input(s): CHOL, HDL, LDLCALC, TRIG in the last 8760 hours.  Cardiac Enzymes: Recent Labs    09/25/17 1400 09/25/17 1910 09/26/17 0202  TROPONINI 0.04* <0.03 <0.03   BNP: No results for input(s): BNP in the last 8760 hours. No results found for: [facility] Lab Results  Component Value Date   HGBA1C (H) 08/30/2010    5.7 (NOTE)                                                                        According to the ADA Clinical Practice Recommendations for 2011, when HbA1c is used as a screening test:   >=6.5%   Diagnostic of Diabetes Mellitus           (if abnormal result  is confirmed)  5.7-6.4%   Increased risk of developing Diabetes Mellitus  References:Diagnosis and Classification of Diabetes Mellitus,Diabetes GUYQ,0347,42(VZDGL 1):S62-S69 and Standards of Medical Care in         Diabetes - 2011,Diabetes Care,2011,34  (Suppl 1):S11-S61.   Lab Results  Component Value Date   TSH 1.106 08/30/2010   No results found for: VITAMINB12 No results found for: FOLATE No results found for: IRON, TIBC, FERRITIN  Imaging and Procedures obtained prior to SNF admission: Ct Head Wo Contrast  Result Date: 09/25/2017 CLINICAL DATA:  Patient was found slumped over  walker earlier this morning, unresponsive. EXAM: CT HEAD WITHOUT CONTRAST TECHNIQUE: Contiguous axial images were obtained from the base of the skull through the vertex without intravenous contrast. COMPARISON:  Multiple priors, most recent 04/08/2017. FINDINGS: Brain: Extensive brain parenchymal calcification of a symmetric nature, consistent with Fahr's disease. This involves the supratentorial white matter, deep nuclei, brainstem, and cerebellum. An acute area of infarction or hemorrhage is not identified. Vascular: No hyperdense vessel. Skull: Calvarium intact. Sinuses/Orbits: No layering fluid. Other: Compared with priors, a similar appearance is noted. IMPRESSION: Extensive brain parenchymal calcification consistent with Fahr's disease. No acute findings are evident. Electronically Signed   By: Staci Righter M.D.   On: 09/25/2017 13:47   Dg Chest Portable 1 View  Result Date: 09/25/2017 CLINICAL DATA:  62 year old male with altered mental status EXAM: PORTABLE CHEST 1 VIEW COMPARISON:  Prior chest x-ray and chest CT 03/29/2016 FINDINGS: Stable cardiac and mediastinal contours.  Implantable loop recorder projects over the left chest. Epicardial pacing leads are also visible. The lungs are clear. No focal airspace consolidation visualized. Chronic atelectasis in the inferior lingula and left lower lobe. Stable chronic bronchitic changes. No pneumothorax or pleural effusion. No acute osseous abnormality. IMPRESSION: No active disease. Electronically Signed   By: Jacqulynn Cadet M.D.   On: 09/25/2017 10:34   Dg Abd 2 Views  Result Date: 09/25/2017 CLINICAL DATA:  Found this morning slumped over his walker and unresponsive, abdominal pain and watery stools, history CHF, heart transplant, Fahr syndrome EXAM: ABDOMEN - 2 VIEW COMPARISON:  None FINDINGS: Epicardial pacing wires and loop recorder project over heart. Lung bases clear. Normal bowel gas pattern. No bowel dilatation or bowel wall thickening. No free air. Nonobstructing RIGHT renal calculus 8 x 5 mm diameter. No acute osseous findings. IMPRESSION: Nonobstructing 8 x 5 mm RIGHT renal calculus. Normal bowel gas pattern Electronically Signed   By: Lavonia Dana M.D.   On: 09/25/2017 13:47     Not all labs, radiology exams or other studies done during hospitalization come through on my EPIC note; however they are reviewed by me.    Assessment and Plan  History heart transplant/chronic immunosuppressant- tacrilimus 1 mg twice daily: Continue ASA 81 mg daily, Coreg 3.125 mg twice daily, patient is on statin  Hypertension-continue Norvasc 2.5 mg nightly and Coreg 3.125 mg twice daily  GERD- continue Protonix 40 mg daily  Anxiety- continue Xanax 0.5 mg twice daily as needed for 2 weeks  Fahry's disease followed at [facility]  Multiple sclerosis- no specific medications for same; continue Percocet 5/325 1 p.o. every 8 pain  Hyperlipidemia-continue Pravachol 40 mg daily   Time spent greater than 45 minutes;> 50% of time with patient was spent reviewing records, labs, tests and studies, counseling and developing plan of  care  Webb Silversmith D. Sheppard Coil, MD

## 2017-12-10 ENCOUNTER — Encounter: Payer: Self-pay | Admitting: Internal Medicine

## 2017-12-10 DIAGNOSIS — K219 Gastro-esophageal reflux disease without esophagitis: Secondary | ICD-10-CM | POA: Insufficient documentation

## 2017-12-10 DIAGNOSIS — F419 Anxiety disorder, unspecified: Secondary | ICD-10-CM | POA: Insufficient documentation

## 2017-12-10 DIAGNOSIS — E785 Hyperlipidemia, unspecified: Secondary | ICD-10-CM | POA: Insufficient documentation

## 2019-02-19 DEATH — deceased
